# Patient Record
Sex: Male | Born: 1960
Health system: Southern US, Community
[De-identification: ages and names within clinical notes are randomized; demographics above are authoritative.]

## PROBLEM LIST (undated history)

## (undated) DIAGNOSIS — Z889 Allergy status to unspecified drugs, medicaments and biological substances status: Secondary | ICD-10-CM

## (undated) DIAGNOSIS — K219 Gastro-esophageal reflux disease without esophagitis: Secondary | ICD-10-CM

## (undated) DIAGNOSIS — T4145XA Adverse effect of unspecified anesthetic, initial encounter: Secondary | ICD-10-CM

## (undated) DIAGNOSIS — T8859XA Other complications of anesthesia, initial encounter: Secondary | ICD-10-CM

## (undated) HISTORY — PX: TONSILLECTOMY: SUR1361

---

## 1998-10-26 ENCOUNTER — Ambulatory Visit (HOSPITAL_COMMUNITY): Admission: RE | Admit: 1998-10-26 | Discharge: 1998-10-26 | Payer: Self-pay | Admitting: Gastroenterology

## 2006-03-13 ENCOUNTER — Ambulatory Visit (HOSPITAL_COMMUNITY): Admission: RE | Admit: 2006-03-13 | Discharge: 2006-03-13 | Payer: Self-pay | Admitting: Orthopedic Surgery

## 2009-01-17 ENCOUNTER — Emergency Department (HOSPITAL_COMMUNITY): Admission: EM | Admit: 2009-01-17 | Discharge: 2009-01-17 | Payer: Self-pay | Admitting: Emergency Medicine

## 2010-01-22 ENCOUNTER — Encounter: Payer: Self-pay | Admitting: Thoracic Surgery (Cardiothoracic Vascular Surgery)

## 2011-01-05 ENCOUNTER — Emergency Department (HOSPITAL_COMMUNITY)
Admission: EM | Admit: 2011-01-05 | Discharge: 2011-01-05 | Disposition: A | Discharge status: Home / Self Care | Payer: Self-pay | Source: Home / Self Care | Attending: Family Medicine | Admitting: Family Medicine

## 2011-01-05 ENCOUNTER — Encounter: Payer: Self-pay | Admitting: *Deleted

## 2011-01-05 DIAGNOSIS — L03039 Cellulitis of unspecified toe: Secondary | ICD-10-CM

## 2011-01-05 DIAGNOSIS — L03032 Cellulitis of left toe: Secondary | ICD-10-CM

## 2011-01-05 MED ORDER — TRAMADOL HCL 50 MG PO TABS: 50.0000 mg | ORAL_TABLET | Freq: Four times a day (QID) | ORAL | Status: AC | PRN

## 2011-01-05 MED ORDER — SULFAMETHOXAZOLE-TRIMETHOPRIM 800-160 MG PO TABS: 1.0000 | ORAL_TABLET | Freq: Two times a day (BID) | ORAL | Status: AC

## 2011-01-05 NOTE — ED Notes (Signed)
Trimmed toenail    3  Days  Ago  Has   Now  Symptoms  Of  Tender  Redness  And  Swelling  Of  The  l    Big toe           He  Is  Ambulatory  And  Is  In  No  Severe  Distress

## 2011-01-05 NOTE — ED Provider Notes (Signed)
History     CSN: [id number]  Arrival date & time 01/05/11  1505   First MD Initiated Contact with Patient 01/05/11 1634      Chief Complaint  Patient presents with  . Toe Pain    (Consider location/radiation/quality/duration/timing/severity/associated sxs/prior treatment) HPI Comments: Pt states he felt like his Lt great toenail was ingrowing  - medial edge, so he cut it back and the corner "to the quick" 3 days ago. The next day he began with redness and swelling and is progressively worsening. He has been applying antibiotic ointment and dressing daily without improvement.    History reviewed. No pertinent past medical history.  History reviewed. No pertinent past surgical history.  Family History  Problem Relation Age of Onset  . Hypertension Mother     History  Substance Use Topics  . Smoking status: Not on file  . Smokeless tobacco: Not on file  . Alcohol Use: Yes     socialy      Review of Systems  Constitutional: Negative for fever and chills.  Musculoskeletal: Negative for joint swelling.    Allergies  Lincocin  Home Medications   Current Outpatient Rx  Name Route Sig Dispense Refill  . SULFAMETHOXAZOLE-TRIMETHOPRIM 800-160 MG PO TABS Oral Take 1 tablet by mouth every 12 (twelve) hours. 20 tablet 0  . TRAMADOL HCL 50 MG PO TABS Oral Take 1 tablet (50 mg total) by mouth every 6 (six) hours as needed for pain. 12 tablet 0    BP 142/83  Pulse 74  Temp(Src) 98.7 F (37.1 C) (Oral)  Resp 18  SpO2 100%  Physical Exam  Nursing note and vitals reviewed. Constitutional: He appears well-developed and well-nourished. No distress.  Cardiovascular: Normal rate, regular rhythm and normal heart sounds.   Pulses:      Dorsalis pedis pulses are 2+ on the right side, and 2+ on the left side.       Posterior tibial pulses are 2+ on the right side, and 2+ on the left side.  Pulmonary/Chest: Effort normal and breath sounds normal. No respiratory distress.    Musculoskeletal:       Left foot: He exhibits tenderness and swelling. He exhibits normal range of motion, no bony tenderness and no laceration.       Feet:  Skin: Skin is warm and dry.  Psychiatric: He has a normal mood and affect.    ED Course  Procedures (including critical care time)  Labs Reviewed - No data to display No results found.   1. Paronychia of great toe of left foot       MDM   Paronychia Lt great toe, secondary to trimming nail.        Melody Comas, [place] 01/05/11 1746

## 2011-01-06 NOTE — ED Provider Notes (Signed)
Medical screening examination/treatment/procedure(s) were performed by non-physician practitioner and as supervising physician I was immediately available for consultation/collaboration.   MORENO-COLL,Hershal Eriksson; MD   Jina Olenick Moreno-Coll, MD 01/06/11 0647 

## 2014-12-14 ENCOUNTER — Encounter (HOSPITAL_COMMUNITY): Payer: Self-pay | Admitting: *Deleted

## 2014-12-14 ENCOUNTER — Emergency Department (HOSPITAL_COMMUNITY)
Admission: EM | Admit: 2014-12-14 | Discharge: 2014-12-14 | Disposition: A | Discharge status: Home / Self Care | Payer: 59 | Source: Home / Self Care

## 2014-12-14 DIAGNOSIS — J069 Acute upper respiratory infection, unspecified: Secondary | ICD-10-CM

## 2014-12-14 MED ORDER — AMOXICILLIN 250 MG PO CAPS: 250.0000 mg | ORAL_CAPSULE | Freq: Two times a day (BID) | ORAL | Status: DC

## 2014-12-14 NOTE — ED Notes (Signed)
Sinus  Drainage   /  Congestion   With   Symptoms  X 3-4  Days   With  Associated  Cough

## 2014-12-14 NOTE — Discharge Instructions (Signed)

## 2014-12-14 NOTE — ED Provider Notes (Signed)
CSN: [id number]     Arrival date & time 12/14/14  1706 History   None    No chief complaint on file.  (Consider location/radiation/quality/duration/timing/severity/associated sxs/prior Treatment) The history is provided by the patient. No language interpreter was used.   URI symptoms for the last few days. Body aches, cough. OTC meds not helping. No past medical history on file. No past surgical history on file. Family History  Problem Relation Age of Onset  . Hypertension Mother    Social History  Substance Use Topics  . Smoking status: Not on file  . Smokeless tobacco: Not on file  . Alcohol Use: Yes     Comment: socialy    Review of Systems  Constitutional: Negative.   HENT: Positive for congestion.   Eyes: Negative.   Respiratory: Positive for cough.     Allergies  Lincomycin hcl  Home Medications   Prior to Admission medications   Not on File   Meds Ordered and Administered this Visit  Medications - No data to display  There were no vitals taken for this visit. No data found.   Physical Exam  Constitutional: He is oriented to person, place, and time. He appears well-developed and well-nourished.  HENT:  Head: Normocephalic and atraumatic.  Right Ear: External ear normal.  Left Ear: External ear normal.  Mouth/Throat: Oropharynx is clear and moist.  Eyes: Conjunctivae are normal.  Neck: Normal range of motion. Neck supple.  Cardiovascular: Normal rate.   Pulmonary/Chest: Effort normal.  Abdominal: Soft.  Musculoskeletal: Normal range of motion.  Lymphadenopathy:    He has no cervical adenopathy.  Neurological: He is alert and oriented to person, place, and time.  Skin: Skin is warm and dry.  Psychiatric: He has a normal mood and affect. His behavior is normal. Thought content normal.  Nursing note and vitals reviewed.   ED Course  Procedures (including critical care time)  Labs Review Labs Reviewed - No data to display  Imaging Review No  results found.   Visual Acuity Review  Right Eye Distance:   Left Eye Distance:   Bilateral Distance:    Right Eye Near:   Left Eye Near:    Bilateral Near:         MDM   1. Acute URI    Continue symptomatic treatment at home. No indication for antibiotics at this time. Structures of care provided discharged home in stable condition.  THIS NOTE WAS GENERATED USING A VOICE RECOGNITION SOFTWARE PROGRAM. ALL REASONABLE EFFORTS  WERE MADE TO PROOFREAD THIS DOCUMENT FOR ACCURACY.     Tharon AquasFrank C Kacey Dysert, PA 12/15/14 1500

## 2015-09-09 DIAGNOSIS — M25561 Pain in right knee: Secondary | ICD-10-CM | POA: Diagnosis not present

## 2015-09-09 DIAGNOSIS — M25562 Pain in left knee: Secondary | ICD-10-CM | POA: Diagnosis not present

## 2015-09-23 DIAGNOSIS — M25561 Pain in right knee: Secondary | ICD-10-CM | POA: Diagnosis not present

## 2015-09-23 DIAGNOSIS — M23321 Other meniscus derangements, posterior horn of medial meniscus, right knee: Secondary | ICD-10-CM | POA: Diagnosis not present

## 2015-09-26 NOTE — Progress Notes (Signed)
Scheduling pre op appt- please place SURGICAL ORDERS IN EPIC   thanks 

## 2015-09-27 ENCOUNTER — Ambulatory Visit: Payer: Self-pay | Admitting: Orthopedic Surgery

## 2015-09-28 ENCOUNTER — Encounter (HOSPITAL_COMMUNITY): Payer: Self-pay

## 2015-09-29 ENCOUNTER — Encounter (HOSPITAL_COMMUNITY): Payer: Self-pay

## 2015-09-29 ENCOUNTER — Ambulatory Visit: Payer: Self-pay | Admitting: Orthopedic Surgery

## 2015-09-29 ENCOUNTER — Encounter (HOSPITAL_COMMUNITY)
Admission: RE | Admit: 2015-09-29 | Discharge: 2015-09-29 | Disposition: A | Discharge status: Home / Self Care | Payer: 59 | Source: Ambulatory Visit | Attending: Orthopedic Surgery | Admitting: Orthopedic Surgery

## 2015-09-29 DIAGNOSIS — X58XXXA Exposure to other specified factors, initial encounter: Secondary | ICD-10-CM | POA: Diagnosis not present

## 2015-09-29 DIAGNOSIS — Z01818 Encounter for other preprocedural examination: Secondary | ICD-10-CM | POA: Insufficient documentation

## 2015-09-29 DIAGNOSIS — S83206A Unspecified tear of unspecified meniscus, current injury, right knee, initial encounter: Secondary | ICD-10-CM | POA: Insufficient documentation

## 2015-09-29 HISTORY — DX: Gastro-esophageal reflux disease without esophagitis: K21.9

## 2015-09-29 HISTORY — DX: Adverse effect of unspecified anesthetic, initial encounter: T41.45XA

## 2015-09-29 HISTORY — DX: Allergy status to unspecified drugs, medicaments and biological substances: Z88.9

## 2015-09-29 HISTORY — DX: Other complications of anesthesia, initial encounter: T88.59XA

## 2015-09-29 NOTE — Patient Instructions (Addendum)
Samuel Sims  09/29/2015   Your procedure is scheduled on: 10-05-15  Report to [facility] Main  Entrance take [facility]  elevators to 3rd floor to  Short Stay Center at   3:30 PM.  Call this number if you have problems the morning of surgery [phone]   Remember: ONLY 1 PERSON MAY GO WITH YOU TO SHORT STAY TO GET  READY MORNING OF YOUR SURGERY.  Do not eat food or drink liquids :After Midnight., exception- may have Clear liquids 12 midnight to 1100 AM, then nothing.   CLEAR LIQUID DIET   Foods Allowed                                                                     Foods Excluded  Coffee and tea, regular and decaf                             liquids that you cannot  Plain Jell-O in any flavor                                             see through such as: Fruit ices (not with fruit pulp)                                     milk, soups, orange juice  Iced Popsicles                                    All solid food Carbonated beverages, regular and diet                                    Cranberry, grape and apple juices Sports drinks like Gatorade Lightly seasoned clear broth or consume(fat free) Sugar, honey syrup   _____________________________________________________________________       Take these medicines the morning of surgery with A SIP OF WATER: None. DO NOT TAKE ANY DIABETIC MEDICATIONS DAY OF YOUR SURGERY                               You may not have any metal on your body including hair pins and              piercings  Do not wear jewelry, make-up, lotions, powders or perfumes, deodorant             Do not wear nail polish.  Do not shave  48 hours prior to surgery.              Men may shave face and neck.   Do not bring valuables to the hospital. [facility] IS NOT             RESPONSIBLE  FOR VALUABLES.  Contacts, dentures or bridgework may not be worn into surgery.  Leave suitcase in the car. After surgery it may be brought to  your room.     Patients discharged the day of surgery will not be allowed to drive home.  Name and phone number of your driver: Samuel Sims-spouse - [phone] cell  Special Instructions: N/A              Please read over the following fact sheets you were given: _____________________________________________________________________             [facility] - Preparing for Surgery Before surgery, you can play an important role.  Because skin is not sterile, your skin needs to be as free of germs as possible.  You can reduce the number of germs on your skin by washing with CHG (chlorahexidine gluconate) soap before surgery.  CHG is an antiseptic cleaner which kills germs and bonds with the skin to continue killing germs even after washing. Please DO NOT use if you have an allergy to CHG or antibacterial soaps.  If your skin becomes reddened/irritated stop using the CHG and inform your nurse when you arrive at Short Stay. Do not shave (including legs and underarms) for at least 48 hours prior to the first CHG shower.  You may shave your face/neck. Please follow these instructions carefully:  1.  Shower with CHG Soap the night before surgery and the  morning of Surgery.  2.  If you choose to wash your hair, wash your hair first as usual with your  normal  shampoo.  3.  After you shampoo, rinse your hair and body thoroughly to remove the  shampoo.                           4.  Use CHG as you would any other liquid soap.  You can apply chg directly  to the skin and wash                       Gently with a scrungie or clean washcloth.  5.  Apply the CHG Soap to your body ONLY FROM THE NECK DOWN.   Do not use on face/ open                           Wound or open sores. Avoid contact with eyes, ears mouth and genitals (private parts).                       Wash face,  Genitals (private parts) with your normal soap.             6.  Wash thoroughly, paying special attention to the area where your surgery   will be performed.  7.  Thoroughly rinse your body with warm water from the neck down.  8.  DO NOT shower/wash with your normal soap after using and rinsing off  the CHG Soap.                9.  Pat yourself dry with a clean towel.            10.  Wear clean pajamas.            11.  Place clean sheets on your bed the night of your first shower and do not  sleep with pets. Day of  Surgery : Do not apply any lotions/deodorants the morning of surgery.  Please wear clean clothes to the hospital/surgery center.  FAILURE TO FOLLOW THESE INSTRUCTIONS MAY RESULT IN THE CANCELLATION OF YOUR SURGERY PATIENT SIGNATURE_________________________________  NURSE SIGNATURE__________________________________  ________________________________________________________________________   Rogelia MireIncentive Spirometer  An incentive spirometer is a tool that can help keep your lungs clear and active. This tool measures how well you are filling your lungs with each breath. Taking long deep breaths may help reverse or decrease the chance of developing breathing (pulmonary) problems (especially infection) following:  A long period of time when you are unable to move or be active. BEFORE THE PROCEDURE   If the spirometer includes an indicator to show your best effort, your nurse or respiratory therapist will set it to a desired goal.  If possible, sit up straight or lean slightly forward. Try not to slouch.  Hold the incentive spirometer in an upright position. INSTRUCTIONS FOR USE  1. Sit on the edge of your bed if possible, or sit up as far as you can in bed or on a chair. 2. Hold the incentive spirometer in an upright position. 3. Breathe out normally. 4. Place the mouthpiece in your mouth and seal your lips tightly around it. 5. Breathe in slowly and as deeply as possible, raising the piston or the ball toward the top of the column. 6. Hold your breath for 3-5 seconds or for as long as possible. Allow the piston or  ball to fall to the bottom of the column. 7. Remove the mouthpiece from your mouth and breathe out normally. 8. Rest for a few seconds and repeat Steps 1 through 7 at least 10 times every 1-2 hours when you are awake. Take your time and take a few normal breaths between deep breaths. 9. The spirometer may include an indicator to show your best effort. Use the indicator as a goal to work toward during each repetition. 10. After each set of 10 deep breaths, practice coughing to be sure your lungs are clear. If you have an incision (the cut made at the time of surgery), support your incision when coughing by placing a pillow or rolled up towels firmly against it. Once you are able to get out of bed, walk around indoors and cough well. You may stop using the incentive spirometer when instructed by your caregiver.  RISKS AND COMPLICATIONS  Take your time so you do not get dizzy or light-headed.  If you are in pain, you may need to take or ask for pain medication before doing incentive spirometry. It is harder to take a deep breath if you are having pain. AFTER USE  Rest and breathe slowly and easily.  It can be helpful to keep track of a log of your progress. Your caregiver can provide you with a simple table to help with this. If you are using the spirometer at home, follow these instructions: SEEK MEDICAL CARE IF:   You are having difficultly using the spirometer.  You have trouble using the spirometer as often as instructed.  Your pain medication is not giving enough relief while using the spirometer.  You develop fever of 100.5 F (38.1 C) or higher. SEEK IMMEDIATE MEDICAL CARE IF:   You cough up bloody sputum that had not been present before.  You develop fever of 102 F (38.9 C) or greater.  You develop worsening pain at or near the incision site. MAKE SURE YOU:   Understand these instructions.  Will watch  your condition.  Will get help right away if you are not doing well  or get worse. Document Released: 04/30/2006 Document Revised: 03/12/2011 Document Reviewed: 07/01/2006 [facility] Patient Information 2014 [place], [place].   ________________________________________________________________________

## 2015-09-29 NOTE — Progress Notes (Signed)
09-29-15 0930 Pt procedure does not require PCR screening, please advise if you still want this test done preop. Pt here today 1000 AM for PAT appointment.

## 2015-10-05 ENCOUNTER — Ambulatory Visit (HOSPITAL_COMMUNITY): Payer: 59 | Admitting: Certified Registered"

## 2015-10-05 ENCOUNTER — Ambulatory Visit (HOSPITAL_COMMUNITY)
Admission: RE | Admit: 2015-10-05 | Discharge: 2015-10-05 | Disposition: A | Discharge status: Home / Self Care | Payer: 59 | Source: Ambulatory Visit | Attending: Orthopedic Surgery | Admitting: Orthopedic Surgery

## 2015-10-05 ENCOUNTER — Encounter (HOSPITAL_COMMUNITY)
Admission: RE | Disposition: A | Discharge status: Home / Self Care | Payer: Self-pay | Source: Ambulatory Visit | Attending: Orthopedic Surgery

## 2015-10-05 ENCOUNTER — Encounter (HOSPITAL_COMMUNITY): Payer: Self-pay | Admitting: *Deleted

## 2015-10-05 DIAGNOSIS — Z79899 Other long term (current) drug therapy: Secondary | ICD-10-CM | POA: Diagnosis not present

## 2015-10-05 DIAGNOSIS — X58XXXA Exposure to other specified factors, initial encounter: Secondary | ICD-10-CM | POA: Insufficient documentation

## 2015-10-05 DIAGNOSIS — S83249A Other tear of medial meniscus, current injury, unspecified knee, initial encounter: Secondary | ICD-10-CM | POA: Diagnosis present

## 2015-10-05 DIAGNOSIS — M23303 Other meniscus derangements, unspecified medial meniscus, right knee: Secondary | ICD-10-CM | POA: Diagnosis not present

## 2015-10-05 DIAGNOSIS — M23331 Other meniscus derangements, other medial meniscus, right knee: Secondary | ICD-10-CM | POA: Diagnosis not present

## 2015-10-05 DIAGNOSIS — S83241A Other tear of medial meniscus, current injury, right knee, initial encounter: Secondary | ICD-10-CM | POA: Insufficient documentation

## 2015-10-05 DIAGNOSIS — M23321 Other meniscus derangements, posterior horn of medial meniscus, right knee: Secondary | ICD-10-CM | POA: Diagnosis not present

## 2015-10-05 HISTORY — PX: KNEE ARTHROSCOPY: SHX127

## 2015-10-05 SURGERY — ARTHROSCOPY, KNEE
Anesthesia: General | Site: Knee | Laterality: Right

## 2015-10-05 MED ORDER — BUPIVACAINE HCL (PF) 0.25 % IJ SOLN
INTRAMUSCULAR | Status: DC | PRN
  Administered 2015-10-05: 20 mL via INTRA_ARTICULAR

## 2015-10-05 MED ORDER — DEXTROSE 5 % IV SOLN
500.0000 mg | Freq: Once | INTRAVENOUS | Status: AC
  Administered 2015-10-05: 500 mg via INTRAVENOUS
  Filled 2015-10-05: qty 550

## 2015-10-05 MED ORDER — OXYCODONE HCL 5 MG PO TABS
5.0000 mg | ORAL_TABLET | Freq: Once | ORAL | Status: AC | PRN
  Administered 2015-10-05: 5 mg via ORAL
  Filled 2015-10-05: qty 1

## 2015-10-05 MED ORDER — FENTANYL CITRATE (PF) 250 MCG/5ML IJ SOLN
INTRAMUSCULAR | Status: AC
  Filled 2015-10-05: qty 5

## 2015-10-05 MED ORDER — KETOROLAC TROMETHAMINE 30 MG/ML IJ SOLN
INTRAMUSCULAR | Status: AC
  Filled 2015-10-05: qty 1

## 2015-10-05 MED ORDER — HYDROMORPHONE HCL 1 MG/ML IJ SOLN: 0.2500 mg | INTRAMUSCULAR | Status: DC | PRN

## 2015-10-05 MED ORDER — OXYCODONE HCL 5 MG/5ML PO SOLN: 5.0000 mg | Freq: Once | ORAL | Status: AC | PRN

## 2015-10-05 MED ORDER — PROMETHAZINE HCL 25 MG/ML IJ SOLN: 6.2500 mg | INTRAMUSCULAR | Status: DC | PRN

## 2015-10-05 MED ORDER — ONDANSETRON HCL 4 MG/2ML IJ SOLN
INTRAMUSCULAR | Status: AC
  Filled 2015-10-05: qty 2

## 2015-10-05 MED ORDER — METHOCARBAMOL 500 MG PO TABS: 500.0000 mg | ORAL_TABLET | Freq: Four times a day (QID) | ORAL | 1 refills | Status: DC

## 2015-10-05 MED ORDER — BUPIVACAINE HCL (PF) 0.25 % IJ SOLN
INTRAMUSCULAR | Status: AC
  Filled 2015-10-05: qty 30

## 2015-10-05 MED ORDER — ONDANSETRON HCL 4 MG/2ML IJ SOLN
INTRAMUSCULAR | Status: DC | PRN
  Administered 2015-10-05: 4 mg via INTRAVENOUS

## 2015-10-05 MED ORDER — ACETAMINOPHEN 10 MG/ML IV SOLN
1000.0000 mg | Freq: Once | INTRAVENOUS | Status: AC
  Administered 2015-10-05: 1000 mg via INTRAVENOUS
  Filled 2015-10-05: qty 100

## 2015-10-05 MED ORDER — CEFAZOLIN SODIUM-DEXTROSE 2-4 GM/100ML-% IV SOLN
2.0000 g | INTRAVENOUS | Status: AC
  Administered 2015-10-05: 2 g via INTRAVENOUS
  Filled 2015-10-05: qty 100

## 2015-10-05 MED ORDER — ACETAMINOPHEN 10 MG/ML IV SOLN
INTRAVENOUS | Status: AC
  Filled 2015-10-05: qty 100

## 2015-10-05 MED ORDER — MIDAZOLAM HCL 2 MG/2ML IJ SOLN
INTRAMUSCULAR | Status: AC
  Filled 2015-10-05: qty 2

## 2015-10-05 MED ORDER — LACTATED RINGERS IR SOLN
Status: DC | PRN
  Administered 2015-10-05 (×3): 3000 mL

## 2015-10-05 MED ORDER — LIDOCAINE 2% (20 MG/ML) 5 ML SYRINGE
INTRAMUSCULAR | Status: AC
  Filled 2015-10-05: qty 5

## 2015-10-05 MED ORDER — HYDROCODONE-ACETAMINOPHEN 5-325 MG PO TABS: 1.0000 | ORAL_TABLET | ORAL | 0 refills | Status: DC | PRN

## 2015-10-05 MED ORDER — DEXAMETHASONE SODIUM PHOSPHATE 10 MG/ML IJ SOLN
10.0000 mg | Freq: Once | INTRAMUSCULAR | Status: AC
  Administered 2015-10-05: 10 mg via INTRAVENOUS

## 2015-10-05 MED ORDER — FENTANYL CITRATE (PF) 250 MCG/5ML IJ SOLN
INTRAMUSCULAR | Status: DC | PRN
  Administered 2015-10-05 (×4): 50 ug via INTRAVENOUS

## 2015-10-05 MED ORDER — DEXAMETHASONE SODIUM PHOSPHATE 10 MG/ML IJ SOLN
INTRAMUSCULAR | Status: AC
  Filled 2015-10-05: qty 1

## 2015-10-05 MED ORDER — CEFAZOLIN SODIUM-DEXTROSE 2-4 GM/100ML-% IV SOLN
INTRAVENOUS | Status: AC
  Filled 2015-10-05: qty 100

## 2015-10-05 MED ORDER — LACTATED RINGERS IV SOLN
INTRAVENOUS | Status: DC
  Administered 2015-10-05: 16:00:00 via INTRAVENOUS

## 2015-10-05 MED ORDER — CHLORHEXIDINE GLUCONATE 4 % EX LIQD: 60.0000 mL | Freq: Once | CUTANEOUS | Status: DC

## 2015-10-05 MED ORDER — MIDAZOLAM HCL 2 MG/2ML IJ SOLN
INTRAMUSCULAR | Status: DC | PRN
  Administered 2015-10-05: 2 mg via INTRAVENOUS

## 2015-10-05 MED ORDER — KETOROLAC TROMETHAMINE 30 MG/ML IJ SOLN
30.0000 mg | Freq: Once | INTRAMUSCULAR | Status: AC | PRN
  Administered 2015-10-05: 30 mg via INTRAVENOUS

## 2015-10-05 MED ORDER — DEXAMETHASONE SODIUM PHOSPHATE 10 MG/ML IJ SOLN: 10.0000 mg | Freq: Once | INTRAMUSCULAR | Status: DC

## 2015-10-05 MED ORDER — PROPOFOL 10 MG/ML IV BOLUS
INTRAVENOUS | Status: DC | PRN
  Administered 2015-10-05: 180 mg via INTRAVENOUS

## 2015-10-05 MED ORDER — ACETAMINOPHEN 10 MG/ML IV SOLN: 1000.0000 mg | Freq: Once | INTRAVENOUS | Status: DC

## 2015-10-05 MED ORDER — LIDOCAINE 2% (20 MG/ML) 5 ML SYRINGE
INTRAMUSCULAR | Status: DC | PRN
  Administered 2015-10-05: 80 mg via INTRAVENOUS

## 2015-10-05 MED ORDER — PROPOFOL 10 MG/ML IV BOLUS
INTRAVENOUS | Status: AC
  Filled 2015-10-05: qty 20

## 2015-10-05 SURGICAL SUPPLY — 30 items
BANDAGE ACE 6X5 VEL STRL LF (GAUZE/BANDAGES/DRESSINGS) ×1 IMPLANT
BANDAGE ELASTIC 6 VELCRO ST LF (GAUZE/BANDAGES/DRESSINGS) ×2 IMPLANT
BLADE 4.2CUDA (BLADE) ×3 IMPLANT
CLOSURE WOUND 1/2 X4 (GAUZE/BANDAGES/DRESSINGS) ×1
COVER SURGICAL LIGHT HANDLE (MISCELLANEOUS) ×3 IMPLANT
CUFF TOURN SGL QUICK 34 (TOURNIQUET CUFF) ×3
CUFF TRNQT CYL 34X4X40X1 (TOURNIQUET CUFF) ×1 IMPLANT
DRAPE U-SHAPE 47X51 STRL (DRAPES) ×3 IMPLANT
DRSG EMULSION OIL 3X3 NADH (GAUZE/BANDAGES/DRESSINGS) ×3 IMPLANT
DRSG PAD ABDOMINAL 8X10 ST (GAUZE/BANDAGES/DRESSINGS) ×1 IMPLANT
DURAPREP 26ML APPLICATOR (WOUND CARE) ×3 IMPLANT
GAUZE SPONGE 4X4 12PLY STRL (GAUZE/BANDAGES/DRESSINGS) ×3 IMPLANT
GLOVE BIO SURGEON STRL SZ8 (GLOVE) ×3 IMPLANT
GLOVE BIOGEL PI IND STRL 8 (GLOVE) ×1 IMPLANT
GLOVE BIOGEL PI INDICATOR 8 (GLOVE) ×2
GOWN STRL REUS W/TWL LRG LVL3 (GOWN DISPOSABLE) ×3 IMPLANT
KIT BASIN OR (CUSTOM PROCEDURE TRAY) ×3 IMPLANT
MANIFOLD NEPTUNE II (INSTRUMENTS) ×3 IMPLANT
MARKER SKIN DUAL TIP RULER LAB (MISCELLANEOUS) ×3 IMPLANT
PACK ARTHROSCOPY WL (CUSTOM PROCEDURE TRAY) ×3 IMPLANT
PACK ICE MAXI GEL EZY WRAP (MISCELLANEOUS) ×3 IMPLANT
PAD ABD 8X10 STRL (GAUZE/BANDAGES/DRESSINGS) ×2 IMPLANT
PADDING CAST COTTON 6X4 STRL (CAST SUPPLIES) ×4 IMPLANT
POSITIONER SURGICAL ARM (MISCELLANEOUS) ×3 IMPLANT
STRIP CLOSURE SKIN 1/2X4 (GAUZE/BANDAGES/DRESSINGS) ×1 IMPLANT
SUT ETHILON 4 0 PS 2 18 (SUTURE) ×3 IMPLANT
TOWEL OR 17X26 10 PK STRL BLUE (TOWEL DISPOSABLE) ×3 IMPLANT
TUBING ARTHRO INFLOW-ONLY STRL (TUBING) ×3 IMPLANT
WAND HAND CNTRL MULTIVAC 90 (MISCELLANEOUS) IMPLANT
WRAP KNEE MAXI GEL POST OP (GAUZE/BANDAGES/DRESSINGS) ×3 IMPLANT

## 2015-10-05 NOTE — Anesthesia Preprocedure Evaluation (Signed)
Anesthesia Evaluation  Patient identified by MRN, date of birth, ID band Patient awake    Reviewed: Allergy & Precautions, NPO status , Patient's Chart, lab work & pertinent test results  Airway Mallampati: II  TM Distance: >3 FB Neck ROM: Full    Dental no notable dental hx.    Pulmonary neg pulmonary ROS,    Pulmonary exam normal breath sounds clear to auscultation       Cardiovascular negative cardio ROS Normal cardiovascular exam Rhythm:Regular Rate:Normal     Neuro/Psych negative neurological ROS  negative psych ROS   GI/Hepatic negative GI ROS, Neg liver ROS,   Endo/Other  negative endocrine ROS  Renal/GU negative Renal ROS  negative genitourinary   Musculoskeletal negative musculoskeletal ROS (+)   Abdominal   Peds negative pediatric ROS (+)  Hematology negative hematology ROS (+)   Anesthesia Other Findings   Reproductive/Obstetrics negative OB ROS                             Anesthesia Physical Anesthesia Plan  ASA: I  Anesthesia Plan: General   Post-op Pain Management:    Induction: Intravenous  Airway Management Planned: LMA  Additional Equipment:   Intra-op Plan:   Post-operative Plan: Extubation in OR  Informed Consent: I have reviewed the patients History and Physical, chart, labs and discussed the procedure including the risks, benefits and alternatives for the proposed anesthesia with the patient or authorized representative who has indicated his/her understanding and acceptance.   Dental advisory given  Plan Discussed with: CRNA and Surgeon  Anesthesia Plan Comments:         Anesthesia Quick Evaluation  

## 2015-10-05 NOTE — H&P (Signed)
CC- Samuel Sims is a 55 y.o. male who presents with right knee pain.  HPI- . Knee Pain: Patient presents with knee pain involving the  right knee. Onset of the symptoms was several months ago. Inciting event: none known. Current symptoms include giving out, pain located medially and popping sensation. Pain is aggravated by lateral movements, pivoting, rising after sitting, squatting, standing and walking.  Patient has had no prior knee problems. Evaluation to date: MRI: abnormal medial meniscal tear. Treatment to date: rest.  Past Medical History:  Diagnosis Date  . Complication of anesthesia    Dental procedures required more than planned to sedate.  Marland Kitchen. GERD (gastroesophageal reflux disease)    past history -used OTC "Prilosec", no problems now  . History of seasonal allergies    more in younger yrs, not in adult years"ragweed mostly"    Past Surgical History:  Procedure Laterality Date  . TONSILLECTOMY      Prior to Admission medications   Medication Sig Start Date End Date Taking? Authorizing Provider  Calcium Carb-Cholecalciferol (CALCIUM 500 + D3 PO) Take 1 tablet by mouth daily. 500 mg D3   Yes Historical Provider, MD  ibuprofen (ADVIL,MOTRIN) 200 MG tablet Take 200 mg by mouth every 6 (six) hours as needed for moderate pain.    Yes Historical Provider, MD  magnesium gluconate (MAGONATE) 500 MG tablet Take 500 mg by mouth daily.   Yes Historical Provider, MD  Multiple Vitamin (MULTIVITAMIN) tablet Take 1 tablet by mouth daily.   Yes Historical Provider, MD  vitamin E 400 UNIT capsule Take 400 Units by mouth daily.   Yes Historical Provider, MD   KNEE EXAM antalgic gait, soft tissue tenderness over medial joint line, no effusion, negative drawer sign, collateral ligaments intact  Physical Examination: General appearance - alert, well appearing, and in no distress Mental status - alert, oriented to person, place, and time Chest - clear to auscultation, no wheezes, rales or  rhonchi, symmetric air entry Heart - normal rate, regular rhythm, normal S1, S2, no murmurs, rubs, clicks or gallops Abdomen - soft, nontender, nondistended, no masses or organomegaly Neurological - alert, oriented, normal speech, no focal findings or movement disorder noted   Asessment/Plan--- Right knee medial meniscal tear- - Plan right knee arthroscopy with meniscal debridement. Procedure risks and potential comps discussed with patient who elects to proceed. Goals are decreased pain and increased function with a high likelihood of achieving both

## 2015-10-05 NOTE — Anesthesia Procedure Notes (Signed)
Procedure Name: LMA Insertion Date/Time: 10/05/2015 4:48 PM Performed by: Minerva EndsMIRARCHI, Isella Slatten M Pre-anesthesia Checklist: Patient identified, Emergency Drugs available, Suction available and Patient being monitored Patient Re-evaluated:Patient Re-evaluated prior to inductionOxygen Delivery Method: Circle System Utilized Preoxygenation: Pre-oxygenation with 100% oxygen Intubation Type: IV induction Ventilation: Mask ventilation without difficulty LMA: LMA inserted LMA Size: 4.0 Number of attempts: 1 Airway Equipment and Method: Bite block Placement Confirmation: positive ETCO2 Tube secured with: Tape Dental Injury: Teeth and Oropharynx as per pre-operative assessment  Comments: Smooth iv induction   Am crna atraumatic lma-- teeth and mouth as preop

## 2015-10-05 NOTE — Transfer of Care (Signed)
Immediate Anesthesia Transfer of Care Note  Patient: Hessie DienerRoger L Piccini  Procedure(s) Performed: Procedure(s): ARTHROSCOPY KNEE WITH MEDIAL MENISCAL DEBRIDEMENT (Right)  Patient Location: PACU  Anesthesia Type:General  Level of Consciousness: awake  Airway & Oxygen Therapy: Patient Spontanous Breathing and Patient connected to face mask oxygen  Post-op Assessment: Report given to RN and Post -op Vital signs reviewed and stable  Post vital signs: Reviewed and stable  Last Vitals:  Vitals:   10/05/15 1521  BP: (!) 135/95  Pulse: 75  Resp: 16  Temp: 37.2 C    Last Pain:  Vitals:   10/05/15 1605  TempSrc:   PainSc: 0-No pain      Patients Stated Pain Goal: 3 (10/05/15 1605)  Complications: No apparent anesthesia complications

## 2015-10-05 NOTE — Discharge Instructions (Signed)
General Anesthesia, Adult, Care After °Refer to this sheet in the next few weeks. These instructions provide you with information on caring for yourself after your procedure. Your health care provider may also give you more specific instructions. Your treatment has been planned according to current medical practices, but problems sometimes occur. Call your health care provider if you have any problems or questions after your procedure. °WHAT TO EXPECT AFTER THE PROCEDURE °After the procedure, it is typical to experience: °· Sleepiness. °· Nausea and vomiting. °HOME CARE INSTRUCTIONS °· For the first 24 hours after general anesthesia: °¨ Have a responsible person with you. °¨ Do not drive a car. If you are alone, do not take public transportation. °¨ Do not drink alcohol. °¨ Do not take medicine that has not been prescribed by your health care provider. °¨ Do not sign important papers or make important decisions. °¨ You may resume a normal diet and activities as directed by your health care provider. °· Change bandages (dressings) as directed. °· If you have questions or problems that seem related to general anesthesia, call the hospital and ask for the anesthetist or anesthesiologist on call. °SEEK MEDICAL CARE IF: °· You have nausea and vomiting that continue the day after anesthesia. °· You develop a rash. °SEEK IMMEDIATE MEDICAL CARE IF:  °· You have difficulty breathing. °· You have chest pain. °· You have any allergic problems. °  °This information is not intended to replace advice given to you by your health care provider. Make sure you discuss any questions you have with your health care provider. °  °Document Released: 03/26/2000 Document Revised: 01/08/2014 Document Reviewed: 04/18/2011 °Elsevier Interactive Patient Education ©2016 [employer] °   ° °Dr. Frank Aluisio °Total Joint Specialist °[facility] °[address] °[place], [place] [address] °[phone] ° ° °Arthroscopic  Procedure, Knee °An arthroscopic procedure can find what is wrong with your knee. °PROCEDURE °Arthroscopy is a surgical technique that allows your orthopedic surgeon to diagnose and treat your knee injury with accuracy. They will look into your knee through a small instrument. This is almost like a small (pencil sized) telescope. Because arthroscopy affects your knee less than open knee surgery, you can anticipate a more rapid recovery. Taking an active role by following your caregiver's instructions will help with rapid and complete recovery. Use crutches, rest, elevation, ice, and knee exercises as instructed. The length of recovery depends on various factors including type of injury, age, physical condition, medical conditions, and your rehabilitation. °Your knee is the joint between the large bones (femur and tibia) in your leg. Cartilage covers these bone ends which are smooth and slippery and allow your knee to bend and move smoothly. Two menisci, thick, semi-lunar shaped pads of cartilage which form a rim inside the joint, help absorb shock and stabilize your knee. Ligaments bind the bones together and support your knee joint. Muscles move the joint, help support your knee, and take stress off the joint itself. Because of this all programs and physical therapy to rehabilitate an injured or repaired knee require rebuilding and strengthening your muscles. °AFTER THE PROCEDURE °· After the procedure, you will be moved to a recovery area until most of the effects of the medication have worn off. Your caregiver will discuss the test results with you.  °· Only take over-the-counter or prescription medicines for pain, discomfort, or fever as directed by your caregiver.  °SEEK MEDICAL CARE IF:  °· You have increased bleeding from your wounds.  °·   You see redness, swelling, or have increasing pain in your wounds.  °· You have pus coming from your wound.  °· You have an oral temperature above 102° F (38.9° C).  °· You  notice a bad smell coming from the wound or dressing.  °· You have severe pain with any motion of your knee.  °SEEK IMMEDIATE MEDICAL CARE IF:  °· You develop a rash.  °· You have difficulty breathing.  °· You have any allergic problems.  °FURTHER INSTRUCTIONS:  °· ICE to the affected knee every three hours for 30 minutes at a time and then as needed for pain and swelling.  Continue to use ice on the knee for pain and swelling from surgery. You may notice swelling that will progress down to the foot and ankle.  This is normal after surgery.  Elevate the leg when you are not up walking on it.   ° °DIET °You may resume your previous home diet once your are discharged from the hospital. ° °DRESSING / WOUND CARE / SHOWERING °You may start showering two days after being discharged home but do not submerge the incisions under water.  °Change dressing 48 hours after the procedure and then cover the small incisions with band aids until your follow up visit. °Change the surgical dressings daily and reapply a dry dressing each time.  ° °ACTIVITY °Walk with your walker as instructed. °Use walker as long as suggested by your caregivers. °Avoid periods of inactivity such as sitting longer than an hour when not asleep. This helps prevent blood clots.  °You may resume a sexual relationship in one month or when given the OK by your doctor.  °You may return to work once you are cleared by your doctor.  °Do not drive a car for 6 weeks or until released by you surgeon.  °Do not drive while taking narcotics. ° °WEIGHT BEARING AS TOLERATED °  °POSTOPERATIVE CONSTIPATION PROTOCOL °Constipation - defined medically as fewer than three stools per week and severe constipation as less than one stool per week. ° °One of the most common issues patients have following surgery is constipation.  Even if you have a regular bowel pattern at home, your normal regimen is likely to be disrupted due to multiple reasons following surgery.  Combination of  anesthesia, postoperative narcotics, change in appetite and fluid intake all can affect your bowels.  In order to avoid complications following surgery, here are some recommendations in order to help you during your recovery period. ° °Colace (docusate) - Pick up an over-the-counter form of Colace or another stool softener and take twice a day as long as you are requiring postoperative pain medications.  Take with a full glass of water daily.  If you experience loose stools or diarrhea, hold the colace until you stool forms back up.  If your symptoms do not get better within 1 week or if they get worse, check with your doctor. ° °Dulcolax (bisacodyl) - Pick up over-the-counter and take as directed by the product packaging as needed to assist with the movement of your bowels.  Take with a full glass of water.  Use this product as needed if not relieved by Colace only.  ° °MiraLax (polyethylene glycol) - Pick up over-the-counter to have on hand.  MiraLax is a solution that will increase the amount of water in your bowels to assist with bowel movements.  Take as directed and can mix with a glass of water, juice, soda, coffee, or tea.    Take if you go more than two days without a movement. °Do not use MiraLax more than once per day. Call your doctor if you are still constipated or irregular after using this medication for 7 days in a row. ° °If you continue to have problems with postoperative constipation, please contact the office for further assistance and recommendations.  If you experience "the worst abdominal pain ever" or develop nausea or vomiting, please contact the office immediatly for further recommendations for treatment. ° °ITCHING ° If you experience itching with your medications, try taking only a single pain pill, or even half a pain pill at a time.  You can also use Benadryl over the counter for itching or also to help with sleep.  ° °TED HOSE STOCKINGS °Wear the elastic stockings on both legs for three  weeks following surgery during the day but you may remove then at night for sleeping. ° °MEDICATIONS °See your medication summary on the “After Visit Summary” that the nursing staff will review with you prior to discharge.  You may have some home medications which will be placed on hold until you complete the course of blood thinner medication.  It is important for you to complete the blood thinner medication as prescribed by your surgeon.  Continue your approved medications as instructed at time of discharge. °Do not drive while taking narcotics.  ° °PRECAUTIONS °If you experience chest pain or shortness of breath - call 911 immediately for transfer to the hospital emergency department.  °If you develop a fever greater that 101 F, purulent drainage from wound, increased redness or drainage from wound, foul odor from the wound/dressing, or calf pain - CONTACT YOUR SURGEON.   °                                                °FOLLOW-UP APPOINTMENTS °Make sure you keep all of your appointments after your operation with your surgeon and caregivers. You should call the office at [phone]  and make an appointment for approximately one week after the date of your surgery or on the date instructed by your surgeon outlined in the "After Visit Summary". ° °RANGE OF MOTION AND STRENGTHENING EXERCISES  °Rehabilitation of the knee is important following a knee injury or an operation. After just a few days of immobilization, the muscles of the thigh which control the knee become weakened and shrink (atrophy). Knee exercises are designed to build up the tone and strength of the thigh muscles and to improve knee motion. Often times heat used for twenty to thirty minutes before working out will loosen up your tissues and help with improving the range of motion but do not use heat for the first two weeks following surgery. These exercises can be done on a training (exercise) mat, on the floor, on a table or on a bed. Use what  ever works the best and is most comfortable for you Knee exercises include: ° °QUAD STRENGTHENING EXERCISES °Strengthening Quadriceps Sets ° °Tighten muscles on top of thigh by pushing knees down into floor or table. °Hold for 20 seconds. Repeat 10 times. °Do 2 sessions per day. ° ° ° ° °Strengthening Terminal Knee Extension ° °With knee bent over bolster, straighten knee by tightening muscle on top of thigh. Be sure to keep bottom of knee on bolster. °Hold for 20 seconds. Repeat   10 times. °Do 2 sessions per day. ° ° °Straight Leg with Bent Knee ° °Lie on back with opposite leg bent. Keep involved knee slightly bent at knee and raise leg 4-6". Hold for 10 seconds. °Repeat 20 times per set. °Do 2 sets per session. °Do 2 sessions per day. ° °

## 2015-10-05 NOTE — Anesthesia Postprocedure Evaluation (Signed)
Anesthesia Post Note  Patient: Samuel Sims  Procedure(s) Performed: Procedure(s) (LRB): ARTHROSCOPY KNEE WITH MEDIAL MENISCAL DEBRIDEMENT (Right)  Patient location during evaluation: PACU Anesthesia Type: General Level of consciousness: awake and alert Pain management: pain level controlled Vital Signs Assessment: post-procedure vital signs reviewed and stable Respiratory status: spontaneous breathing, nonlabored ventilation, respiratory function stable and patient connected to nasal cannula oxygen Cardiovascular status: blood pressure returned to baseline and stable Postop Assessment: no signs of nausea or vomiting Anesthetic complications: no    Last Vitals:  Vitals:   10/05/15 1800 10/05/15 1813  BP:  (!) 138/91  Pulse: 63 62  Resp: 13 15  Temp: 36.6 C 36.9 C    Last Pain:  Vitals:   10/05/15 1821  TempSrc:   PainSc: 5                  Masayo Fera S

## 2015-10-05 NOTE — Interval H&P Note (Signed)
History and Physical Interval Note:  10/05/2015 4:36 PM  Samuel Sims L Kanzler  has presented today for surgery, with the diagnosis of RIGHT KNEE MEDIAL MENISCUS TEAR  The various methods of treatment have been discussed with the patient and family. After consideration of risks, benefits and other options for treatment, the patient has consented to  Procedure(s): ARTHROSCOPY KNEE WITH MENISCUS DEBRIDEMENT (Right) as a surgical intervention .  The patient's history has been reviewed, patient examined, no change in status, stable for surgery.  I have reviewed the patient's chart and labs.  Questions were answered to the patient's satisfaction.     Loanne DrillingALUISIO,Hara Milholland V

## 2015-10-05 NOTE — Op Note (Signed)
Operative Report- KNEE ARTHROSCOPY  Preoperative diagnosis-  Right knee medial meniscal tear  Postoperative diagnosis Right- knee medial meniscal tear  Procedure- Right knee arthroscopy with medial  meniscal debridement    Surgeon- Gus RankinFrank V. Jorgina Binning, MD  Anesthesia-General  EBL-  Minimal  Complications- None  Condition- PACU - hemodynamically stable.  Brief clinical note- -Samuel Sims is a 55 y.o.  male with a several month history of Right pain and mechanical symptoms. Exam and history suggested medial  meniscal tear confirmed by MRI. The patient presents now for arthroscopy and debridement   Procedure in detail -       After successful administration of General anesthetic, a tourmiquet is placed high on the Right  thigh and the Right lower extremity is prepped and draped in the usual sterile fashion. Time out is performed by the surgical team. Standard superomedial and inferolateral portal sites are marked and incisions made with an 11 blade. The inflow cannula is passed through the superomedial portal and camera through the inferolateral portal and inflow is initiated. Arthroscopic visualization proceeds.      The undersurface of the patella and trochlea are visualized and they are normal. The medial and lateral gutters are visualized and there are  no loose bodies. Flexion and valgus force is applied to the knee and the medial compartment is entered. A spinal needle is passed into the joint through the site marked for the inferomedial portal. A small incision is made and the dilator passed into the joint. The findings for the medial compartment are unstable tear of body and posterior horn medial meniscus with Grade II and II degenerative changes of small 1 x 2 cm area of medial femoral condyle without any unstable chondral lesion . The tear is debrided to a stable base with baskets and a shaver and sealed off with the Arthrocare. It is probed and found to be stable.     The  intercondylar notch is visualized and the ACL appears normal . The lateral compartment is entered and the findings are normal .      The joint is again inspected and there are no other tears, defects or loose bodies identified. The arthroscopic equipment is then removed from the inferior portals which are closed with interrupted 4-0 nylon. 20 ml of .25% Marcaine with epinephrine are injected through the inflow cannula and the cannula is then removed and the portal closed with nylon. The incisions are cleaned and dried and a bulky sterile dressing is applied. The patient is then awakened and transported to recovery in stable condition.   10/05/2015, 5:27 PM

## 2015-10-06 ENCOUNTER — Encounter (HOSPITAL_COMMUNITY): Payer: Self-pay | Admitting: Orthopedic Surgery

## 2015-10-21 DIAGNOSIS — Z4789 Encounter for other orthopedic aftercare: Secondary | ICD-10-CM | POA: Diagnosis not present

## 2015-11-11 DIAGNOSIS — H52223 Regular astigmatism, bilateral: Secondary | ICD-10-CM | POA: Diagnosis not present

## 2015-11-11 MED FILL — TOBRAMYCIN-DEXAMETH OPTH SU: 0.3-0.1 | 7 days supply | Qty: 5 | Fill #0

## 2015-12-09 DIAGNOSIS — M23321 Other meniscus derangements, posterior horn of medial meniscus, right knee: Secondary | ICD-10-CM | POA: Diagnosis not present

## 2016-01-31 ENCOUNTER — Encounter (HOSPITAL_COMMUNITY): Payer: Self-pay | Admitting: Emergency Medicine

## 2016-01-31 ENCOUNTER — Ambulatory Visit (HOSPITAL_COMMUNITY)
Admission: EM | Admit: 2016-01-31 | Discharge: 2016-01-31 | Disposition: A | Discharge status: Home / Self Care | Payer: 59 | Attending: Family Medicine | Admitting: Family Medicine

## 2016-01-31 DIAGNOSIS — R05 Cough: Secondary | ICD-10-CM

## 2016-01-31 DIAGNOSIS — R69 Illness, unspecified: Principal | ICD-10-CM

## 2016-01-31 DIAGNOSIS — R509 Fever, unspecified: Secondary | ICD-10-CM

## 2016-01-31 DIAGNOSIS — J111 Influenza due to unidentified influenza virus with other respiratory manifestations: Secondary | ICD-10-CM

## 2016-01-31 DIAGNOSIS — M791 Myalgia: Secondary | ICD-10-CM

## 2016-01-31 DIAGNOSIS — J3489 Other specified disorders of nose and nasal sinuses: Secondary | ICD-10-CM

## 2016-01-31 MED ORDER — IBUPROFEN 800 MG PO TABS: 800.0000 mg | ORAL_TABLET | Freq: Three times a day (TID) | ORAL | 0 refills | Status: DC

## 2016-01-31 NOTE — ED Triage Notes (Signed)
Pt began with body aches late this morning, but 1500 he states he had a fever of 101.2.  He took tylenol and it brought the fever down at 1810

## 2016-01-31 NOTE — ED Provider Notes (Signed)
[facility]    CSN: [id number] Arrival date & time: 01/31/16  1955     History   Chief Complaint Chief Complaint  Patient presents with  . Generalized Body Aches  . Chills  . Fever    HPI Samuel Sims is a 56 y.o. male.   The history is provided by the patient.  Influenza  Presenting symptoms: cough, fever, myalgias and rhinorrhea   Severity:  Mild Onset quality:  Sudden Duration:  12 hours Progression:  Worsening Chronicity:  New Relieved by:  None tried Worsened by:  Nothing Ineffective treatments:  None tried Associated symptoms: chills, decreased appetite and decreased physical activity     Past Medical History:  Diagnosis Date  . Complication of anesthesia    Dental procedures required more than planned to sedate.  Marland Kitchen GERD (gastroesophageal reflux disease)    past history -used OTC "Prilosec", no problems now  . History of seasonal allergies    more in younger yrs, not in adult years"ragweed mostly"    Patient Active Problem List   Diagnosis Date Noted  . Acute medial meniscal tear 10/05/2015    Past Surgical History:  Procedure Laterality Date  . KNEE ARTHROSCOPY Right 10/05/2015   Procedure: ARTHROSCOPY KNEE WITH MEDIAL MENISCAL DEBRIDEMENT;  Surgeon: Ollen Gross, MD;  Location: [facility];  Service: Orthopedics;  Laterality: Right;  . TONSILLECTOMY         Home Medications    Prior to Admission medications   Medication Sig Start Date End Date Taking? Authorizing Provider  Calcium Carb-Cholecalciferol (CALCIUM 500 + D3 PO) Take 1 tablet by mouth daily. 500 mg D3   Yes Historical Provider, MD  magnesium gluconate (MAGONATE) 500 MG tablet Take 500 mg by mouth daily.   Yes Historical Provider, MD  Multiple Vitamin (MULTIVITAMIN) tablet Take 1 tablet by mouth daily.   Yes Historical Provider, MD  vitamin E 400 UNIT capsule Take 400 Units by mouth daily.   Yes Historical Provider, MD  HYDROcodone-acetaminophen (NORCO) 5-325 MG tablet  Take 1-2 tablets by mouth every 4 (four) hours as needed for moderate pain. 10/05/15   Ollen Gross, MD  ibuprofen (ADVIL,MOTRIN) 800 MG tablet Take 1 tablet (800 mg total) by mouth 3 (three) times daily. 01/31/16   Linna Hoff, MD  methocarbamol (ROBAXIN) 500 MG tablet Take 1 tablet (500 mg total) by mouth 4 (four) times daily. As needed for muscle spasm 10/05/15   Ollen Gross, MD    Family History Family History  Problem Relation Age of Onset  . Hypertension Mother     Social History Social History  Substance Use Topics  . Smoking status: Never Smoker  . Smokeless tobacco: Never Used  . Alcohol use Yes     Comment: socialy     Allergies   Lincomycin hcl and Benadryl [diphenhydramine hcl]   Review of Systems Review of Systems  Constitutional: Positive for chills, decreased appetite and fever.  HENT: Positive for rhinorrhea.   Respiratory: Positive for cough. Negative for wheezing.   Cardiovascular: Negative.   Gastrointestinal: Negative.   Genitourinary: Negative.   Musculoskeletal: Positive for myalgias.     Physical Exam Triage Vital Signs ED Triage Vitals [01/31/16 2038]  Enc Vitals Group     BP 141/76     Pulse Rate 101     Resp      Temp 99.8 F (37.7 C)     Temp Source Oral     SpO2 98 %  Weight      Height      Head Circumference      Peak Flow      Pain Score      Pain Loc      Pain Edu?      Excl. in GC?    No data found.   Updated Vital Signs BP 141/76 (BP Location: Right Arm)   Pulse 101   Temp 99.8 F (37.7 C) (Oral)   SpO2 98%   Visual Acuity Right Eye Distance:   Left Eye Distance:   Bilateral Distance:    Right Eye Near:   Left Eye Near:    Bilateral Near:     Physical Exam  Constitutional: He is oriented to person, place, and time. He appears well-developed and well-nourished. No distress.  HENT:  Right Ear: External ear normal.  Left Ear: External ear normal.  Nose: Nose normal.  Mouth/Throat: Oropharynx is clear  and moist.  Eyes: Pupils are equal, round, and reactive to light.  Neck: Normal range of motion. Neck supple.  Cardiovascular: Normal rate, regular rhythm, normal heart sounds and intact distal pulses.   Pulmonary/Chest: Effort normal and breath sounds normal.  Abdominal: Soft. Bowel sounds are normal.  Lymphadenopathy:    He has no cervical adenopathy.  Neurological: He is alert and oriented to person, place, and time.  Skin: Skin is warm and dry.     UC Treatments / Results  Labs (all labs ordered are listed, but only abnormal results are displayed) Labs Reviewed - No data to display  EKG  EKG Interpretation None       Radiology No results found.  Procedures Procedures (including critical care time)  Medications Ordered in UC Medications - No data to display   Initial Impression / Assessment and Plan / UC Course  I have reviewed the triage vital signs and the nursing notes.  Pertinent labs & imaging results that were available during my care of the patient were reviewed by me and considered in my medical decision making (see chart for details).       Final Clinical Impressions(s) / UC Diagnoses   Final diagnoses:  Influenza-like illness    New Prescriptions Discharge Medication List as of 01/31/2016  9:22 PM       Linna HoffJames D Kindl, MD 01/31/16 2135

## 2016-02-01 MED FILL — OSELTAMIVIR PHOSPHATE 75 MG: 75 | 5 days supply | Qty: 10 | Fill #0

## 2016-02-10 DIAGNOSIS — N401 Enlarged prostate with lower urinary tract symptoms: Secondary | ICD-10-CM | POA: Diagnosis not present

## 2016-02-10 DIAGNOSIS — R350 Frequency of micturition: Secondary | ICD-10-CM | POA: Diagnosis not present

## 2016-02-10 MED FILL — TAMSULOSIN HCL 0.4 MG CAP: 0.4 | 30 days supply | Qty: 30 | Fill #0

## 2016-02-10 MED FILL — SULFAMETHOXAZOLE/TMP DS TAB: 800-160 | 15 days supply | Qty: 30 | Fill #0

## 2016-02-24 DIAGNOSIS — M23321 Other meniscus derangements, posterior horn of medial meniscus, right knee: Secondary | ICD-10-CM | POA: Diagnosis not present

## 2016-02-24 DIAGNOSIS — Z4789 Encounter for other orthopedic aftercare: Secondary | ICD-10-CM | POA: Diagnosis not present

## 2016-03-14 DIAGNOSIS — B3742 Candidal balanitis: Secondary | ICD-10-CM | POA: Diagnosis not present

## 2016-03-14 DIAGNOSIS — N403 Nodular prostate with lower urinary tract symptoms: Secondary | ICD-10-CM | POA: Diagnosis not present

## 2016-03-14 DIAGNOSIS — R3912 Poor urinary stream: Secondary | ICD-10-CM | POA: Diagnosis not present

## 2016-03-14 DIAGNOSIS — N401 Enlarged prostate with lower urinary tract symptoms: Secondary | ICD-10-CM | POA: Diagnosis not present

## 2016-03-14 DIAGNOSIS — R350 Frequency of micturition: Secondary | ICD-10-CM | POA: Diagnosis not present

## 2016-03-14 MED FILL — CLOTRIMAZOLE-BETAMETHASONE: 1-0.05 | 10 days supply | Qty: 15 | Fill #0

## 2016-05-25 MED FILL — CLOTRIMAZOLE-BETAMETHASONE: 1-0.05 | 10 days supply | Qty: 15 | Fill #1

## 2017-02-28 DIAGNOSIS — H52223 Regular astigmatism, bilateral: Secondary | ICD-10-CM | POA: Diagnosis not present

## 2017-09-06 DIAGNOSIS — R293 Abnormal posture: Secondary | ICD-10-CM | POA: Diagnosis not present

## 2017-09-06 DIAGNOSIS — M2560 Stiffness of unspecified joint, not elsewhere classified: Secondary | ICD-10-CM | POA: Diagnosis not present

## 2017-09-06 DIAGNOSIS — M9903 Segmental and somatic dysfunction of lumbar region: Secondary | ICD-10-CM | POA: Diagnosis not present

## 2017-09-06 DIAGNOSIS — M545 Low back pain: Secondary | ICD-10-CM | POA: Diagnosis not present

## 2017-09-09 DIAGNOSIS — R293 Abnormal posture: Secondary | ICD-10-CM | POA: Diagnosis not present

## 2017-09-09 DIAGNOSIS — M545 Low back pain: Secondary | ICD-10-CM | POA: Diagnosis not present

## 2017-09-09 DIAGNOSIS — M9903 Segmental and somatic dysfunction of lumbar region: Secondary | ICD-10-CM | POA: Diagnosis not present

## 2017-09-09 DIAGNOSIS — M2560 Stiffness of unspecified joint, not elsewhere classified: Secondary | ICD-10-CM | POA: Diagnosis not present

## 2018-02-28 ENCOUNTER — Ambulatory Visit (INDEPENDENT_AMBULATORY_CARE_PROVIDER_SITE_OTHER): Payer: Self-pay | Admitting: Nurse Practitioner

## 2018-02-28 VITALS — BP 135/85 | HR 83 | Temp 99.1°F | Resp 18 | Wt 220.8 lb

## 2018-02-28 DIAGNOSIS — R6889 Other general symptoms and signs: Secondary | ICD-10-CM

## 2018-02-28 MED ORDER — BALOXAVIR MARBOXIL(80 MG DOSE) 2 X 40 MG PO TBPK: 80.0000 mg | ORAL_TABLET | Freq: Once | ORAL | 0 refills | Status: AC

## 2018-02-28 MED FILL — XOFLUZA 40 (2) MG TBPK: 2 X 40 | 1 days supply | Qty: 2 | Fill #0

## 2018-02-28 NOTE — Patient Instructions (Signed)
Influenza-Like Symptoms, Adult -Take medication as prescribed. -Ibuprofen 800mg  every 8 hours for the next 48 hours pain, fever, or general discomfort. Take this medication with food and water. -Get plenty of rest. -Increase fluids. -Ice to the neck for discomfort for 48 hours then switch to warm moist heat.  -Sleep elevated on at least 2 pillows at bedtime to help with cough if needed. -Use a humidifier or vaporizer when at home and during sleep to help with cough if needed. -Purchase OTC Delsym cough medicine if needed.  Use as directed. -May use a teaspoon of honey or over-the-counter cough drops to help with cough if needed. -I would like you to find a PCP to establish care.  I recommend Dr. Tana Conch at [facility].  You can reach his office at [phone]. -Follow-up if symptoms do not improve.   Influenza, more commonly known as "the flu," is a viral infection that mainly affects the respiratory tract. The respiratory tract includes organs that help you breathe, such as the lungs, nose, and throat. The flu causes many symptoms similar to the common cold along with high fever and body aches. The flu spreads easily from person to person (is contagious). Getting a flu shot (influenza vaccination) every year is the best way to prevent the flu. What are the causes? This condition is caused by the influenza virus. You can get the virus by:  Breathing in droplets that are in the air from an infected person's cough or sneeze.  Touching something that has been exposed to the virus (has been contaminated) and then touching your mouth, nose, or eyes. What increases the risk? The following factors may make you more likely to get the flu:  Not washing or sanitizing your hands often.  Having close contact with many people during cold and flu season.  Touching your mouth, eyes, or nose without first washing or sanitizing your hands.  Not getting a yearly (annual) flu  shot. You may have a higher risk for the flu, including serious problems such as a lung infection (pneumonia), if you:  Are older than 65.  Are pregnant.  Have a weakened disease-fighting system (immune system). You may have a weakened immune system if you: ? Have HIV or AIDS. ? Are undergoing chemotherapy. ? Are taking medicines that reduce (suppress) the activity of your immune system.  Have a long-term (chronic) illness, such as heart disease, kidney disease, diabetes, or lung disease.  Have a liver disorder.  Are severely overweight (morbidly obese).  Have anemia. This is a condition that affects your red blood cells.  Have asthma. What are the signs or symptoms? Symptoms of this condition usually begin suddenly and last 4-14 days. They may include:  Fever and chills.  Headaches, body aches, or muscle aches.  Sore throat.  Cough.  Runny or stuffy (congested) nose.  Chest discomfort.  Poor appetite.  Weakness or fatigue.  Dizziness.  Nausea or vomiting. How is this diagnosed? This condition may be diagnosed based on:  Your symptoms and medical history.  A physical exam.  Swabbing your nose or throat and testing the fluid for the influenza virus. How is this treated? If the flu is diagnosed early, you can be treated with medicine that can help reduce how severe the illness is and how long it lasts (antiviral medicine). This may be given by mouth (orally) or through an IV. Taking care of yourself at home can help relieve symptoms. Your health care provider may recommend:  Taking over-the-counter medicines.  Drinking plenty of fluids. In many cases, the flu goes away on its own. If you have severe symptoms or complications, you may be treated in a hospital. Follow these instructions at home: Activity  Rest as needed and get plenty of sleep.  Stay home from work or school as told by your health care provider. Unless you are visiting your health care  provider, avoid leaving home until your fever has been gone for 24 hours without taking medicine. Eating and drinking  Take an oral rehydration solution (ORS). This is a drink that is sold at pharmacies and retail stores.  Drink enough fluid to keep your urine pale yellow.  Drink clear fluids in small amounts as you are able. Clear fluids include water, ice chips, diluted fruit juice, and low-calorie sports drinks.  Eat bland, easy-to-digest foods in small amounts as you are able. These foods include bananas, applesauce, rice, lean meats, toast, and crackers.  Avoid drinking fluids that contain a lot of sugar or caffeine, such as energy drinks, regular sports drinks, and soda.  Avoid alcohol.  Avoid spicy or fatty foods. General instructions      Take over-the-counter and prescription medicines only as told by your health care provider.  Use a cool mist humidifier to add humidity to the air in your home. This can make it easier to breathe.  Cover your mouth and nose when you cough or sneeze.  Wash your hands with soap and water often, especially after you cough or sneeze. If soap and water are not available, use alcohol-based hand sanitizer.  Keep all follow-up visits as told by your health care provider. This is important. How is this prevented?   Get an annual flu shot. You may get the flu shot in late summer, fall, or winter. Ask your health care provider when you should get your flu shot.  Avoid contact with people who are sick during cold and flu season. This is generally fall and winter. Contact a health care provider if:  You develop new symptoms.  You have: ? Chest pain. ? Diarrhea. ? A fever.  Your cough gets worse.  You produce more mucus.  You feel nauseous or you vomit. Get help right away if:  You develop shortness of breath or difficulty breathing.  Your skin or nails turn a bluish color.  You have severe pain or stiffness in your neck.  You  develop a sudden headache or sudden pain in your face or ear.  You cannot eat or drink without vomiting. Summary  Influenza, more commonly known as "the flu," is a viral infection that primarily affects your respiratory tract.  Symptoms of the flu usually begin suddenly and last 4-14 days.  Getting an annual flu shot is the best way to prevent getting the flu.  Stay home from work or school as told by your health care provider. Unless you are visiting your health care provider, avoid leaving home until your fever has been gone for 24 hours without taking medicine.  Keep all follow-up visits as told by your health care provider. This is important. This information is not intended to replace advice given to you by your health care provider. Make sure you discuss any questions you have with your health care provider. Document Released: 12/16/1999 Document Revised: 06/05/2017 Document Reviewed: 06/05/2017 Elsevier Interactive Patient Education  2019 [employer].

## 2018-02-28 NOTE — Progress Notes (Signed)
MRN: [id number] DOB: 26-Jun-1960  Subjective:   Samuel Sims is a 58 y.o. male presenting for chief complaint of Cough (STARTED THIS MORNING); CHEST CONGESTION (STARTED THIS MORNING); Nasal Congestion (STARTED THIS MORNING); Generalized Body Aches (STARTED THIS MORNING); and Chills (STARTED THIS MORNING) .  Reports several hours history of dry cough and myalgia, chills, fatigue and nasal congestion and chest congestion. Has tried Ibuprofen for relief. Denies sinus headache, sinus congestion , sinus pain, rhinorrhea, sore throat, difficulty swallowing, pain with swallowing, inability to swallow, productive cough, wheezing and shortness of breath, night sweats, weight loss, nausea, vomiting and abdominal pain. Has contact with his son who was diagnosed with influenza. Denies history of seasonal allergies, history of asthma. Patient has had flu shot this season. Denies smoking.. Denies any other aggravating or relieving factors, no other questions or concerns.  Review of Systems  Constitutional: Positive for chills and malaise/fatigue.  HENT: Positive for congestion. Negative for ear discharge, ear pain, sinus pain and sore throat.   Eyes: Negative.   Respiratory: Positive for cough. Negative for sputum production.   Cardiovascular: Negative.   Gastrointestinal: Negative.   Musculoskeletal: Positive for neck pain (stiff neck).  Skin: Negative.   Neurological: Negative.  Negative for headaches.    Messi has a current medication list which includes the following prescription(s): calcium carb-cholecalciferol, ibuprofen, magnesium gluconate, multivitamin, baloxavir marboxil(80 mg dose), hydrocodone-acetaminophen, methocarbamol, and vitamin e. Also is allergic to lincomycin hcl and benadryl [diphenhydramine hcl].  Byntlee  has a past medical history of Complication of anesthesia, GERD (gastroesophageal reflux disease), and History of seasonal allergies. Also  has a past surgical history that includes  Tonsillectomy and Knee arthroscopy (Right, 10/05/2015).   Objective:   Vitals: BP 135/85 (BP Location: Right Arm, Patient Position: Sitting, Cuff Size: Normal)   Pulse 83   Temp 99.1 F (37.3 C) (Oral)   Resp 18   Wt 220 lb 12.8 oz (100.2 kg)   SpO2 95%   BMI 29.95 kg/m   Physical Exam Constitutional:      General: He is not in acute distress. HENT:     Head: Normocephalic.     Right Ear: Tympanic membrane, ear canal and external ear normal.     Left Ear: Tympanic membrane, ear canal and external ear normal.     Nose: Congestion (moderate) and rhinorrhea (clear drainage) present.     Mouth/Throat:     Mouth: Mucous membranes are moist.     Pharynx: No oropharyngeal exudate or posterior oropharyngeal erythema.  Eyes:     Pupils: Pupils are equal, round, and reactive to light.  Neck:     Musculoskeletal: Normal range of motion and neck supple.  Cardiovascular:     Rate and Rhythm: Normal rate and regular rhythm.     Pulses: Normal pulses.     Heart sounds: Normal heart sounds.  Pulmonary:     Effort: Pulmonary effort is normal. No respiratory distress.     Breath sounds: Normal breath sounds. No stridor. No wheezing, rhonchi or rales.  Abdominal:     General: Bowel sounds are normal.     Palpations: Abdomen is soft.     Tenderness: There is no abdominal tenderness.  Lymphadenopathy:     Cervical: No cervical adenopathy.  Neurological:     Mental Status: He is alert.     Assessment and Plan :   Exam findings, diagnosis etiology and medication use and indications reviewed with patient. Follow- Up and discharge instructions provided. No emergent/urgent  issues found on exam.  Based on the patient's clinical presentation, symptoms, sudden onset of symptoms, and known influenza exposure, will go to go ahead and treat the patient with an antiviral.  I am also going to diagnose the patient with influenza-like symptoms without an influenza test as this will not change my course  of treatment.  I am going to go ahead and prescribe Xofluza to the patient for these reasons previously listed.  Patient was also instructed to continue symptomatic treatment to include ibuprofen or Tylenol for pain, fever, or general discomfort, getting plenty of rest, and increasing fluids.  Patient opted to purchase an over-the-counter cough medicine if his symptoms worsened, which I was in agreement with.  The patient is well-appearing, is in no acute distress, and vital signs are stable at this time.  Patient education was provided. Patient verbalized understanding of information provided and agrees with plan of care (POC), all questions answered. The patient is advised to call or return to clinic if condition does not see an improvement in symptoms, or to seek the care of the closest emergency department if condition worsens with the above plan.   1. Influenza-like symptoms  - Baloxavir Marboxil,80 MG Dose, (XOFLUZA) 2 x 40 MG TBPK; Take 80 mg by mouth once for 1 dose.  Dispense: 1 each; Refill: 0 -Take medication as prescribed. -Ibuprofen 800mg  every 8 hours for the next 48 hours pain, fever, or general discomfort. Take this medication with food and water. -Get plenty of rest. -Increase fluids. -Ice to the neck for discomfort for 48 hours then switch to warm moist heat.  -Sleep elevated on at least 2 pillows at bedtime to help with cough if needed. -Use a humidifier or vaporizer when at home and during sleep to help with cough if needed. -Purchase OTC Delsym cough medicine if needed.  Use as directed. -May use a teaspoon of honey or over-the-counter cough drops to help with cough if needed. -I would like you to find a PCP to establish care.  I recommend Dr. Tana Conch at [facility].  You can reach his office at [phone]. -Follow-up if symptoms do not improve.

## 2018-03-03 ENCOUNTER — Telehealth: Payer: Self-pay

## 2018-03-03 NOTE — Telephone Encounter (Signed)
Patient states he was sick on Saturday night  with fever and he felt bad, but on Sunday he stared feeling better and today he feels great.

## 2019-07-06 ENCOUNTER — Encounter: Payer: Self-pay | Admitting: Emergency Medicine

## 2019-07-06 ENCOUNTER — Ambulatory Visit (INDEPENDENT_AMBULATORY_CARE_PROVIDER_SITE_OTHER)
Admission: EM | Admit: 2019-07-06 | Discharge: 2019-07-06 | Disposition: A | Discharge status: Home / Self Care | Payer: 59 | Source: Home / Self Care

## 2019-07-06 ENCOUNTER — Ambulatory Visit (INDEPENDENT_AMBULATORY_CARE_PROVIDER_SITE_OTHER): Payer: 59

## 2019-07-06 ENCOUNTER — Other Ambulatory Visit: Payer: Self-pay

## 2019-07-06 ENCOUNTER — Ambulatory Visit
Admission: RE | Admit: 2019-07-06 | Disposition: A | Discharge status: Home / Self Care | Payer: 59 | Source: Ambulatory Visit

## 2019-07-06 DIAGNOSIS — R2232 Localized swelling, mass and lump, left upper limb: Secondary | ICD-10-CM

## 2019-07-06 DIAGNOSIS — W228XXA Striking against or struck by other objects, initial encounter: Secondary | ICD-10-CM | POA: Diagnosis not present

## 2019-07-06 DIAGNOSIS — M79602 Pain in left arm: Secondary | ICD-10-CM

## 2019-07-06 DIAGNOSIS — M7989 Other specified soft tissue disorders: Secondary | ICD-10-CM | POA: Diagnosis not present

## 2019-07-06 DIAGNOSIS — S59912A Unspecified injury of left forearm, initial encounter: Secondary | ICD-10-CM | POA: Diagnosis not present
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
## 2019-07-06 NOTE — Discharge Instructions (Signed)
X-rays negative for fracture or dislocation Continue conservative management of rest, ice, and elevation Use OTC ibuprofen and/or tylenol as needed for pain Follow up with PCP for further testing/ imaging Return or go to the ER if you have any new or worsening symptoms (fever, chills, chest pain, redness, fever, chills, nausea, vomiting, etc...)
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
## 2019-08-13 DIAGNOSIS — H52223 Regular astigmatism, bilateral: Secondary | ICD-10-CM | POA: Diagnosis not present

## 2020-06-07 ENCOUNTER — Other Ambulatory Visit: Payer: Self-pay

## 2020-06-07 ENCOUNTER — Emergency Department (HOSPITAL_BASED_OUTPATIENT_CLINIC_OR_DEPARTMENT_OTHER): Payer: 59

## 2020-06-07 ENCOUNTER — Encounter (HOSPITAL_BASED_OUTPATIENT_CLINIC_OR_DEPARTMENT_OTHER): Payer: Self-pay | Admitting: Emergency Medicine

## 2020-06-07 ENCOUNTER — Emergency Department (HOSPITAL_BASED_OUTPATIENT_CLINIC_OR_DEPARTMENT_OTHER)
Admission: EM | Admit: 2020-06-07 | Discharge: 2020-06-07 | Disposition: A | Discharge status: Home / Self Care | Payer: 59 | Attending: Emergency Medicine | Admitting: Emergency Medicine

## 2020-06-07 DIAGNOSIS — R1011 Right upper quadrant pain: Secondary | ICD-10-CM | POA: Insufficient documentation

## 2020-06-07 DIAGNOSIS — N200 Calculus of kidney: Secondary | ICD-10-CM | POA: Diagnosis not present

## 2020-06-07 DIAGNOSIS — R197 Diarrhea, unspecified: Secondary | ICD-10-CM | POA: Insufficient documentation

## 2020-06-07 LAB — CBC WITH DIFFERENTIAL/PLATELET
Abs Immature Granulocytes: 0.02 10*3/uL (ref 0.00–0.07)
Basophils Absolute: 0 10*3/uL (ref 0.0–0.1)
Basophils Relative: 0 %
Eosinophils Absolute: 0.1 10*3/uL (ref 0.0–0.5)
Eosinophils Relative: 1 %
HCT: 47.7 % (ref 39.0–52.0)
Hemoglobin: 16.5 g/dL (ref 13.0–17.0)
Immature Granulocytes: 0 %
Lymphocytes Relative: 10 %
Lymphs Abs: 0.8 10*3/uL (ref 0.7–4.0)
MCH: 30.8 pg (ref 26.0–34.0)
MCHC: 34.6 g/dL (ref 30.0–36.0)
MCV: 89.2 fL (ref 80.0–100.0)
Monocytes Absolute: 0.5 10*3/uL (ref 0.1–1.0)
Monocytes Relative: 6 %
Neutro Abs: 6.7 10*3/uL (ref 1.7–7.7)
Neutrophils Relative %: 83 %
Platelets: 205 10*3/uL (ref 150–400)
RBC: 5.35 MIL/uL (ref 4.22–5.81)
RDW: 12.3 % (ref 11.5–15.5)
WBC: 8.1 10*3/uL (ref 4.0–10.5)
nRBC: 0 % (ref 0.0–0.2)

## 2020-06-07 LAB — URINALYSIS, ROUTINE W REFLEX MICROSCOPIC
Bilirubin Urine: NEGATIVE
Glucose, UA: NEGATIVE mg/dL
Hgb urine dipstick: NEGATIVE
Ketones, ur: NEGATIVE mg/dL
Leukocytes,Ua: NEGATIVE
Nitrite: NEGATIVE
Protein, ur: NEGATIVE mg/dL
Specific Gravity, Urine: 1.008 (ref 1.005–1.030)
pH: 6 (ref 5.0–8.0)

## 2020-06-07 LAB — COMPREHENSIVE METABOLIC PANEL
ALT: 25 U/L (ref 0–44)
AST: 21 U/L (ref 15–41)
Albumin: 4.4 g/dL (ref 3.5–5.0)
Alkaline Phosphatase: 52 U/L (ref 38–126)
Anion gap: 9 (ref 5–15)
BUN: 19 mg/dL (ref 6–20)
CO2: 23 mmol/L (ref 22–32)
Calcium: 9 mg/dL (ref 8.9–10.3)
Chloride: 106 mmol/L (ref 98–111)
Creatinine, Ser: 1.11 mg/dL (ref 0.61–1.24)
GFR, Estimated: >60 mL/min (ref >60)
Glucose, Bld: 94 mg/dL (ref 70–99)
Potassium: 4.1 mmol/L (ref 3.5–5.1)
Sodium: 138 mmol/L (ref 135–145)
Total Bilirubin: 1.4 mg/dL — ABNORMAL HIGH (ref 0.3–1.2)
Total Protein: 6.6 g/dL (ref 6.5–8.1)

## 2020-06-07 LAB — LIPASE, BLOOD: Lipase: 21 U/L (ref 11–51)

## 2020-06-07 MED ORDER — SODIUM CHLORIDE 0.9 % IV BOLUS
1000.0000 mL | Freq: Once | INTRAVENOUS | Status: AC
  Administered 2020-06-07: 1000 mL via INTRAVENOUS

## 2020-06-07 MED ORDER — SODIUM CHLORIDE 0.9 % IV SOLN: INTRAVENOUS | Status: DC

## 2020-06-07 NOTE — Discharge Instructions (Addendum)
Work-up today without any acute findings.  Make an appointment with gastroenterology for further evaluation and colonoscopy.  Return for any new or worse symptoms.  If diarrhea reoccurs you could use Imodium A-D over-the-counter.  Review of the CAT scan showed no abnormalities with the gallbladder.  And also showed no other acute findings in the abdomen.

## 2020-06-07 NOTE — ED Triage Notes (Signed)
Intermittent diarrhea x 1 week. sts started as constipation last weel then loose clear stool. Denies NV.

## 2020-06-07 NOTE — ED Provider Notes (Addendum)
[facility] EMERGENCY DEPT Provider Note   CSN: [id number] Arrival date & time: 06/07/20  1032     History Chief Complaint  Patient presents with  . Diarrhea    Samuel Sims is a 60 y.o. male.  Patient with a complaint of diarrhea for a week.  Mostly clear.  Initial onset was with right upper quadrant abdominal pain.  That has resolved.  No prior history of similar problem.  No history of any blood.  The diarrhea has been about 6-7 times a day.  But since patient's been here it has been improving some.  Patient has not been on any antibiotics.  No travel.  No unusual water consumption.  Not associated with fevers.  Not associated with nausea and vomiting        Past Medical History:  Diagnosis Date  . Complication of anesthesia    Dental procedures required more than planned to sedate.  Marland Kitchen GERD (gastroesophageal reflux disease)    past history -used OTC "Prilosec", no problems now  . History of seasonal allergies    more in younger yrs, not in adult years"ragweed mostly"    Patient Active Problem List   Diagnosis Date Noted  . Acute medial meniscal tear 10/05/2015    Past Surgical History:  Procedure Laterality Date  . KNEE ARTHROSCOPY Right 10/05/2015   Procedure: ARTHROSCOPY KNEE WITH MEDIAL MENISCAL DEBRIDEMENT;  Surgeon: Ollen Gross, MD;  Location: [facility];  Service: Orthopedics;  Laterality: Right;  . TONSILLECTOMY         Family History  Problem Relation Age of Onset  . Hypertension Mother     Social History   Tobacco Use  . Smoking status: Never Smoker  . Smokeless tobacco: Never Used  Substance Use Topics  . Alcohol use: Yes    Comment: socialy  . Drug use: No    Home Medications Prior to Admission medications   Medication Sig Start Date End Date Taking? Authorizing Provider  Calcium Carb-Cholecalciferol (CALCIUM 500 + D3 PO) Take 1 tablet by mouth daily. 500 mg D3    [provider]  ibuprofen (ADVIL,MOTRIN) 800 MG  tablet Take 1 tablet (800 mg total) by mouth 3 (three) times daily. 01/31/16   Linna Hoff, MD  magnesium gluconate (MAGONATE) 500 MG tablet Take 500 mg by mouth daily.    [provider]  Multiple Vitamin (MULTIVITAMIN) tablet Take 1 tablet by mouth daily.    [provider]  vitamin E 400 UNIT capsule Take 400 Units by mouth daily.    [provider]    Allergies    Lincomycin hcl and Benadryl [diphenhydramine hcl]  Review of Systems   Review of Systems  Constitutional: Negative for chills and fever.  HENT: Negative for rhinorrhea and sore throat.   Eyes: Negative for visual disturbance.  Respiratory: Negative for cough and shortness of breath.   Cardiovascular: Negative for chest pain and leg swelling.  Gastrointestinal: Positive for abdominal pain and diarrhea. Negative for nausea and vomiting.  Genitourinary: Negative for dysuria.  Musculoskeletal: Negative for back pain and neck pain.  Skin: Negative for rash.  Neurological: Negative for dizziness, light-headedness and headaches.  Hematological: Does not bruise/bleed easily.  Psychiatric/Behavioral: Negative for confusion.    Physical Exam Updated Vital Signs BP (!) 145/91   Pulse 71   Temp 98.7 F (37.1 C) (Oral)   Resp 16   Ht 1.829 m (6')   Wt 97.5 kg   SpO2 99%   BMI  29.16 kg/m   Physical Exam Vitals and nursing note reviewed.  Constitutional:      Appearance: Normal appearance. He is well-developed.  HENT:     Head: Normocephalic and atraumatic.  Eyes:     Extraocular Movements: Extraocular movements intact.     Conjunctiva/sclera: Conjunctivae normal.     Pupils: Pupils are equal, round, and reactive to light.  Cardiovascular:     Rate and Rhythm: Normal rate and regular rhythm.     Heart sounds: No murmur heard.   Pulmonary:     Effort: Pulmonary effort is normal. No respiratory distress.     Breath sounds: Normal breath sounds.  Abdominal:     General: There is no  distension.     Palpations: Abdomen is soft.     Tenderness: There is no abdominal tenderness. There is no guarding.  Musculoskeletal:        General: Normal range of motion.     Cervical back: Normal range of motion and neck supple.  Skin:    General: Skin is warm and dry.  Neurological:     General: No focal deficit present.     Mental Status: He is alert and oriented to person, place, and time.     Cranial Nerves: No cranial nerve deficit.     Sensory: No sensory deficit.     Motor: No weakness.     ED Results / Procedures / Treatments   Labs (all labs ordered are listed, but only abnormal results are displayed) Labs Reviewed  COMPREHENSIVE METABOLIC PANEL - Abnormal; Notable for the following components:      Result Value   Total Bilirubin 1.4 (*)    All other components within normal limits  URINALYSIS, ROUTINE W REFLEX MICROSCOPIC - Abnormal; Notable for the following components:   Color, Urine COLORLESS (*)    All other components within normal limits  LIPASE, BLOOD  CBC WITH DIFFERENTIAL/PLATELET    EKG None  Radiology CT Abdomen Pelvis Wo Contrast  Result Date: 06/07/2020 CLINICAL DATA:  Bilateral lower quadrant abdominal pain and diarrhea. EXAM: CT ABDOMEN AND PELVIS WITHOUT CONTRAST TECHNIQUE: Multidetector CT imaging of the abdomen and pelvis was performed following the standard protocol without IV contrast. COMPARISON:  None. FINDINGS: Lower chest: Unremarkable. Hepatobiliary: Confluent, confluent patchy areas of low density in the liver with geographical areas of sparing. The sparing is most prominent adjacent to the gallbladder fossa. Normal appearing gallbladder. Pancreas: Unremarkable. No pancreatic ductal dilatation or surrounding inflammatory changes. Spleen: Normal in size without focal abnormality. Adrenals/Urinary Tract: Tiny upper pole right renal calculus. Otherwise, unremarkable kidneys, ureters, urinary bladder and adrenal glands. Stomach/Bowel: Stomach  is within normal limits. Appendix appears normal. No evidence of bowel wall thickening, distention, or inflammatory changes. Minimal sigmoid colon diverticulosis. Vascular/Lymphatic: Atheromatous arterial calcifications without aneurysm. No enlarged lymph nodes. Reproductive: Prostate is unremarkable. Other: Small left inguinal hernia containing fat. Tiny umbilical hernia containing fat. Musculoskeletal: Lumbar and lower thoracic spine degenerative changes. IMPRESSION: 1. No acute abnormality. 2. Hepatic steatosis with areas of sparing. 3. Minimal sigmoid colon diverticulosis. 4. Tiny, nonobstructing upper pole right renal calculus. Electronically Signed   By: Beckie Salts M.D.   On: 06/07/2020 14:03    Procedures Procedures   Medications Ordered in ED Medications  0.9 %  sodium chloride infusion ( Intravenous New Bag/Given 06/07/20 1452)  sodium chloride 0.9 % bolus 1,000 mL (0 mLs Intravenous Stopped 06/07/20 1451)    ED Course  I have reviewed the triage vital signs  and the nursing notes.  Pertinent labs & imaging results that were available during my care of the patient were reviewed by me and considered in my medical decision making (see chart for details).    MDM Rules/Calculators/A&P                          Work-up for the right upper quadrant abdominal pain which has resolved.  And the diarrhea without any acute findings.  Patient states that she has had very little diarrhea today.  CT scan of the abdomen showed no abnormalities of the gallbladder.  And small intestines and large intestines were normal.  Labs also normal.  No leukocytosis.  Urinalysis is normal.  Liver function test showed a mild elevation in the bilirubin at 1.4.  But otherwise they were normal.  And lipase was normal.  Will refer patient to gastroenterology.  Colonoscopy would be appropriate both for his age and for this symptom onset.  Is possible that it could have been a form of food poisoning.  Patient stable for  discharge home.  And follow-up with gastroenterology. Final Clinical Impression(s) / ED Diagnoses Final diagnoses:  Diarrhea, unspecified type  Right upper quadrant abdominal pain    Rx / DC Orders ED Discharge Orders    None       Vanetta Mulders, MD 06/07/20 1559    Vanetta Mulders, MD 06/07/20 1600

## 2020-06-13 DIAGNOSIS — K5730 Diverticulosis of large intestine without perforation or abscess without bleeding: Secondary | ICD-10-CM | POA: Diagnosis not present

## 2020-06-13 DIAGNOSIS — R194 Change in bowel habit: Secondary | ICD-10-CM | POA: Diagnosis not present

## 2020-06-13 DIAGNOSIS — R1011 Right upper quadrant pain: Secondary | ICD-10-CM | POA: Diagnosis not present

## 2020-06-13 DIAGNOSIS — R197 Diarrhea, unspecified: Secondary | ICD-10-CM | POA: Diagnosis not present

## 2020-06-13 DIAGNOSIS — K760 Fatty (change of) liver, not elsewhere classified: Secondary | ICD-10-CM | POA: Diagnosis not present

## 2021-05-09 ENCOUNTER — Ambulatory Visit (HOSPITAL_BASED_OUTPATIENT_CLINIC_OR_DEPARTMENT_OTHER)
Admission: RE | Admit: 2021-05-09 | Discharge: 2021-05-09 | Disposition: A | Discharge status: Home / Self Care | Payer: 59 | Source: Ambulatory Visit | Attending: Family | Admitting: Family

## 2021-05-09 ENCOUNTER — Ambulatory Visit: Payer: 59 | Admitting: Family

## 2021-05-09 ENCOUNTER — Encounter: Payer: Self-pay | Admitting: Family

## 2021-05-09 ENCOUNTER — Other Ambulatory Visit (HOSPITAL_COMMUNITY): Payer: Self-pay

## 2021-05-09 VITALS — BP 148/91 | HR 60 | Temp 98.3°F | Resp 16 | Ht 70.0 in | Wt 214.0 lb

## 2021-05-09 DIAGNOSIS — M79641 Pain in right hand: Secondary | ICD-10-CM

## 2021-05-09 DIAGNOSIS — K5909 Other constipation: Secondary | ICD-10-CM | POA: Diagnosis not present

## 2021-05-09 DIAGNOSIS — R109 Unspecified abdominal pain: Secondary | ICD-10-CM | POA: Diagnosis not present

## 2021-05-09 DIAGNOSIS — R030 Elevated blood-pressure reading, without diagnosis of hypertension: Secondary | ICD-10-CM | POA: Diagnosis not present

## 2021-05-09 DIAGNOSIS — M25531 Pain in right wrist: Secondary | ICD-10-CM

## 2021-05-09 DIAGNOSIS — L03113 Cellulitis of right upper limb: Secondary | ICD-10-CM

## 2021-05-09 MED ORDER — CEPHALEXIN 500 MG PO CAPS
500.0000 mg | ORAL_CAPSULE | Freq: Three times a day (TID) | ORAL | 0 refills | Status: DC
  Filled 2021-05-09: qty 21, 7d supply, fill #0

## 2021-05-09 NOTE — Assessment & Plan Note (Signed)
?   Reduced LIH.  He also has a tender firm subcutaneous nodule RUQ. ? Hematoma. Obtain CT for further evaluation once we have completed cmet to assess renal function.  ?

## 2021-05-09 NOTE — Assessment & Plan Note (Signed)
New. Has been present x >1 month. Will rx with keflex.  ?
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
## 2021-05-09 NOTE — Assessment & Plan Note (Signed)
Reports hx of white coat htn.  Will plan to recheck in 1 month at his cpx.  He will check his bp at home the morning of his appointment and bring that reading with him.  ? ?BP Readings from Last 3 Encounters:  ?05/09/21 (!) 148/91  ?06/07/20 (!) 145/91  ?02/28/18 135/85  ? ? ?

## 2021-05-09 NOTE — Patient Instructions (Addendum)
Please complete lab work prior to leaving. ?Complete x-ray of your hand/wrist on the first floor in imaging. ?You should be contacted about scheduling your CT scan. ?Start keflex (antibiotic) for the wound infection on your right wrist.  ?Welcome to [facility]! ?

## 2021-05-09 NOTE — Assessment & Plan Note (Signed)
Encouraged him to increase his water intake and focus on healthier food choices on the days he works.  ?

## 2021-05-10 LAB — COMPREHENSIVE METABOLIC PANEL
ALT: 27 U/L (ref 0–53)
AST: 21 U/L (ref 0–37)
Albumin: 4.6 g/dL (ref 3.5–5.2)
Alkaline Phosphatase: 60 U/L (ref 39–117)
BUN: 24 mg/dL — ABNORMAL HIGH (ref 6–23)
CO2: 25 mEq/L (ref 19–32)
Calcium: 9.4 mg/dL (ref 8.4–10.5)
Chloride: 104 mEq/L (ref 96–112)
Creatinine, Ser: 1.24 mg/dL (ref 0.40–1.50)
GFR: 64.12 mL/min (ref >60.00)
Glucose, Bld: 88 mg/dL (ref 70–99)
Potassium: 4.3 mEq/L (ref 3.5–5.1)
Sodium: 137 mEq/L (ref 135–145)
Total Bilirubin: 1.2 mg/dL (ref 0.2–1.2)
Total Protein: 6.7 g/dL (ref 6.0–8.3)

## 2021-05-17 ENCOUNTER — Ambulatory Visit (HOSPITAL_BASED_OUTPATIENT_CLINIC_OR_DEPARTMENT_OTHER)
Admission: RE | Admit: 2021-05-17 | Discharge: 2021-05-17 | Disposition: A | Discharge status: Home / Self Care | Payer: 59 | Source: Ambulatory Visit | Attending: Family | Admitting: Family

## 2021-05-17 ENCOUNTER — Encounter (HOSPITAL_BASED_OUTPATIENT_CLINIC_OR_DEPARTMENT_OTHER): Payer: Self-pay

## 2021-05-17 DIAGNOSIS — R109 Unspecified abdominal pain: Secondary | ICD-10-CM | POA: Insufficient documentation

## 2021-05-17 DIAGNOSIS — I700 Atherosclerosis of aorta: Secondary | ICD-10-CM | POA: Diagnosis not present

## 2021-05-17 MED ORDER — IOHEXOL 300 MG/ML  SOLN
100.0000 mL | Freq: Once | INTRAMUSCULAR | Status: AC | PRN
  Administered 2021-05-17: 100 mL via INTRAVENOUS

## 2021-08-13 ENCOUNTER — Telehealth: Payer: 59 | Admitting: Urgent Care

## 2021-08-13 DIAGNOSIS — J22 Unspecified acute lower respiratory infection: Secondary | ICD-10-CM | POA: Diagnosis not present

## 2021-08-13 MED ORDER — PROMETHAZINE-DM 6.25-15 MG/5ML PO SYRP: 5.0000 mL | ORAL_SOLUTION | Freq: Four times a day (QID) | ORAL | 0 refills | Status: AC | PRN

## 2021-08-13 MED ORDER — AZITHROMYCIN 250 MG PO TABS
ORAL_TABLET | ORAL | 0 refills | Status: AC
Start: 2021-08-13 — End: 2021-08-18

## 2021-08-13 NOTE — Patient Instructions (Signed)
  Samuel Sims, thank you for joining Maretta Bees, PA for today's virtual visit.  While this provider is not your primary care provider (PCP), if your PCP is located in our provider database this encounter information will be shared with them immediately following your visit.  Consent: (Patient) Samuel Sims provided verbal consent for this virtual visit at the beginning of the encounter.  Current Medications:  Current Outpatient Medications:    azithromycin (ZITHROMAX) 250 MG tablet, Take 2 tablets on day 1, then 1 tablet daily on days 2 through 5, Disp: 6 tablet, Rfl: 0   promethazine-dextromethorphan (PROMETHAZINE-DM) 6.25-15 MG/5ML syrup, Take 5 mLs by mouth 4 (four) times daily as needed for cough. Sedation precaution, Disp: 118 mL, Rfl: 0   calcium carbonate (TUMS - DOSED IN MG ELEMENTAL CALCIUM) 500 MG chewable tablet, Chew 1 tablet by mouth daily., Disp: , Rfl:    Medications ordered in this encounter:  Meds ordered this encounter  Medications   azithromycin (ZITHROMAX) 250 MG tablet    Sig: Take 2 tablets on day 1, then 1 tablet daily on days 2 through 5    Dispense:  6 tablet    Refill:  0    Order Specific Question:   Supervising Provider    Answer:   Merrilee Jansky [[id number]]   promethazine-dextromethorphan (PROMETHAZINE-DM) 6.25-15 MG/5ML syrup    Sig: Take 5 mLs by mouth 4 (four) times daily as needed for cough. Sedation precaution    Dispense:  118 mL    Refill:  0    Order Specific Question:   Supervising Provider    Answer:   Merrilee Jansky [[id number]]     *If you need refills on other medications prior to your next appointment, please contact your pharmacy*  Follow-Up: Call back or seek an in-person evaluation if the symptoms worsen or if the condition fails to improve as anticipated.  Other Instructions Start taking azithromycin today, as prescribed per package instructions. Please only use the cough medication at night to help you sleep. During  the day you can continue mucinex (guaifenesin) 600mg  twice daily, please do not use the one with dextromethorphan or phenylephrine. Purchase a warm mist vaporizer for use at nighttime, or use steam from shower to help break up the mucous in your chest. Eucalyptus or Vicks can also help open up your airway. Drink plenty of water and rest. If your symptoms persist, or if new symptoms develop, please be seen in person.   If you have been instructed to have an in-person evaluation today at a local Urgent Care facility, please use the link below. It will take you to a list of all of our available [facility], including address, phone number and hours of operation. Please do not delay care.  [facility]  If you or a family member do not have a primary care provider, use the link below to schedule a visit and establish care. When you choose a [facility] primary care physician or advanced practice provider, you gain a long-term partner in health. Find a Primary Care Provider  Learn more about [facility] in-office and virtual care options: [facility] - Get Care Now

## 2021-08-13 NOTE — Progress Notes (Unsigned)
Virtual Visit Consent   Samuel Sims, you are scheduled for a virtual visit with a [facility] provider today. Just as with appointments in the office, your consent must be obtained to participate. Your consent will be active for this visit and any virtual visit you may have with one of our providers in the next 365 days. If you have a MyChart account, a copy of this consent can be sent to you electronically.  As this is a virtual visit, video technology does not allow for your provider to perform a traditional examination. This may limit your provider's ability to fully assess your condition. If your provider identifies any concerns that need to be evaluated in person or the need to arrange testing (such as labs, EKG, etc.), we will make arrangements to do so. Although advances in technology are sophisticated, we cannot ensure that it will always work on either your end or our end. If the connection with a video visit is poor, the visit may have to be switched to a telephone visit. With either a video or telephone visit, we are not always able to ensure that we have a secure connection.  By engaging in this virtual visit, you consent to the provision of healthcare and authorize for your insurance to be billed (if applicable) for the services provided during this visit. Depending on your insurance coverage, you may receive a charge related to this service.  I need to obtain your verbal consent now. Are you willing to proceed with your visit today? Samuel Sims has provided verbal consent on 08/13/2021 for a virtual visit (video or telephone). Maretta Bees, PA  Date: 08/13/2021 3:25 PM  Virtual Visit via Video Note   I, Adley Castello L Alleya Demeter, connected with  Samuel Sims  ([id number], 06/30/1960) on 08/13/21 at  3:00 PM EDT by a video-enabled telemedicine application and verified that I am speaking with the correct person using two identifiers.  Location: Patient: Virtual Visit Location  Patient: Home Provider: Virtual Visit Location Provider: Home Office   I discussed the limitations of evaluation and management by telemedicine and the availability of in person appointments. The patient expressed understanding and agreed to proceed.    History of Present Illness: Samuel Sims is a 61 y.o.  male is being seen today for cough.  HPI: Pt presents on day 10 of cough and lower respiratory tract symptoms. Started on 08/03/21 with body aches, joint pain, headaches, productive cough of thick green phlegm. Did have fever initially as well, but states this resolved several days ago. Took a covid test which was reportedly negative. Pt denies SOB at rest, but endorses SOB during paroxysm of coughing. Denies CP, palpitations. Some sinus congestion, but denies sinus pain. He has no chronic medical conditions and takes no daily medications. Has been trying OTC generic mucinex, allergy medication without relief. Laying on left side at night has helped some. Wife with similar symptoms, was seen yesterday and given a Zpack. She has completed two doses and is feeling better. Pt hoping for same tx. No allergy to this medication.    Problems:  Patient Active Problem List   Diagnosis Date Noted   Abdominal pain 05/09/2021   Right hand pain 05/09/2021   Right wrist pain 05/09/2021   Cellulitis of right wrist 05/09/2021   Elevated blood pressure reading 05/09/2021   Intermittent constipation 05/09/2021   Acute medial meniscal tear 10/05/2015    Allergies:  Allergies  Allergen Reactions   Lincomycin  Hcl     Pt reports syncopal episode after received Lincocin. No allergy to PCN.    Benadryl [Diphenhydramine Hcl] Rash    "eyelids broke out in rash"   Medications:  Current Outpatient Medications:    azithromycin (ZITHROMAX) 250 MG tablet, Take 2 tablets on day 1, then 1 tablet daily on days 2 through 5, Disp: 6 tablet, Rfl: 0   promethazine-dextromethorphan (PROMETHAZINE-DM) 6.25-15 MG/5ML  syrup, Take 5 mLs by mouth 4 (four) times daily as needed for cough. Sedation precaution, Disp: 118 mL, Rfl: 0   calcium carbonate (TUMS - DOSED IN MG ELEMENTAL CALCIUM) 500 MG chewable tablet, Chew 1 tablet by mouth daily., Disp: , Rfl:   Observations/Objective: Patient is well-developed, well-nourished in no acute distress.  Resting at home, NAD, non toxic, but acutely ill.  Head is normocephalic, atraumatic.  No labored breathing. Harsh coughing intermittently. Speech is clear and coherent with logical content.  Patient is alert and oriented at baseline.  No scleral icterus, coryza or rhinorrhea. No visible rash.  Assessment and Plan: 1. Lower respiratory tract infection  Pt on day 10, home covid test negative. Has failed supportive measures. Wife with same illness, on day two of azithromycin and seeing significant improvement. Will do cough medication for use only at night, zpack as prescribed. F/U for in person eval should symptoms persist.  Follow Up Instructions: I discussed the assessment and treatment plan with the patient. The patient was provided an opportunity to ask questions and all were answered. The patient agreed with the plan and demonstrated an understanding of the instructions.  A copy of instructions were sent to the patient via MyChart unless otherwise noted below.   The patient was advised to call back or seek an in-person evaluation if the symptoms worsen or if the condition fails to improve as anticipated.  Time:  I spent 8 minutes with the patient via telehealth technology discussing the above problems/concerns.    Kaysi Ourada L Remmie Bembenek, PA

## 2022-03-29 DIAGNOSIS — M542 Cervicalgia: Secondary | ICD-10-CM | POA: Diagnosis not present

## 2022-03-29 DIAGNOSIS — M5451 Vertebrogenic low back pain: Secondary | ICD-10-CM | POA: Diagnosis not present

## 2022-03-29 DIAGNOSIS — M9903 Segmental and somatic dysfunction of lumbar region: Secondary | ICD-10-CM | POA: Diagnosis not present

## 2022-03-29 DIAGNOSIS — M9901 Segmental and somatic dysfunction of cervical region: Secondary | ICD-10-CM | POA: Diagnosis not present

## 2022-04-03 DIAGNOSIS — M9901 Segmental and somatic dysfunction of cervical region: Secondary | ICD-10-CM | POA: Diagnosis not present

## 2022-04-03 DIAGNOSIS — M542 Cervicalgia: Secondary | ICD-10-CM | POA: Diagnosis not present

## 2022-04-03 DIAGNOSIS — M9903 Segmental and somatic dysfunction of lumbar region: Secondary | ICD-10-CM | POA: Diagnosis not present

## 2022-04-03 DIAGNOSIS — M5451 Vertebrogenic low back pain: Secondary | ICD-10-CM | POA: Diagnosis not present

## 2022-04-04 DIAGNOSIS — M5451 Vertebrogenic low back pain: Secondary | ICD-10-CM | POA: Diagnosis not present

## 2022-04-04 DIAGNOSIS — M542 Cervicalgia: Secondary | ICD-10-CM | POA: Diagnosis not present

## 2022-04-04 DIAGNOSIS — M9901 Segmental and somatic dysfunction of cervical region: Secondary | ICD-10-CM | POA: Diagnosis not present

## 2022-04-04 DIAGNOSIS — M9903 Segmental and somatic dysfunction of lumbar region: Secondary | ICD-10-CM | POA: Diagnosis not present

## 2022-04-05 DIAGNOSIS — M9901 Segmental and somatic dysfunction of cervical region: Secondary | ICD-10-CM | POA: Diagnosis not present

## 2022-04-05 DIAGNOSIS — M5451 Vertebrogenic low back pain: Secondary | ICD-10-CM | POA: Diagnosis not present

## 2022-04-05 DIAGNOSIS — M9903 Segmental and somatic dysfunction of lumbar region: Secondary | ICD-10-CM | POA: Diagnosis not present

## 2022-04-05 DIAGNOSIS — M542 Cervicalgia: Secondary | ICD-10-CM | POA: Diagnosis not present

## 2022-04-09 DIAGNOSIS — M9901 Segmental and somatic dysfunction of cervical region: Secondary | ICD-10-CM | POA: Diagnosis not present

## 2022-04-09 DIAGNOSIS — M5451 Vertebrogenic low back pain: Secondary | ICD-10-CM | POA: Diagnosis not present

## 2022-04-09 DIAGNOSIS — M9903 Segmental and somatic dysfunction of lumbar region: Secondary | ICD-10-CM | POA: Diagnosis not present

## 2022-04-09 DIAGNOSIS — M542 Cervicalgia: Secondary | ICD-10-CM | POA: Diagnosis not present

## 2022-04-11 DIAGNOSIS — M9901 Segmental and somatic dysfunction of cervical region: Secondary | ICD-10-CM | POA: Diagnosis not present

## 2022-04-11 DIAGNOSIS — M5451 Vertebrogenic low back pain: Secondary | ICD-10-CM | POA: Diagnosis not present

## 2022-04-11 DIAGNOSIS — M9903 Segmental and somatic dysfunction of lumbar region: Secondary | ICD-10-CM | POA: Diagnosis not present

## 2022-04-11 DIAGNOSIS — M542 Cervicalgia: Secondary | ICD-10-CM | POA: Diagnosis not present

## 2022-04-17 DIAGNOSIS — M5451 Vertebrogenic low back pain: Secondary | ICD-10-CM | POA: Diagnosis not present

## 2022-04-17 DIAGNOSIS — M542 Cervicalgia: Secondary | ICD-10-CM | POA: Diagnosis not present

## 2022-04-17 DIAGNOSIS — M9903 Segmental and somatic dysfunction of lumbar region: Secondary | ICD-10-CM | POA: Diagnosis not present

## 2022-04-17 DIAGNOSIS — M9901 Segmental and somatic dysfunction of cervical region: Secondary | ICD-10-CM | POA: Diagnosis not present

## 2022-04-19 DIAGNOSIS — M5451 Vertebrogenic low back pain: Secondary | ICD-10-CM | POA: Diagnosis not present

## 2022-04-19 DIAGNOSIS — M9901 Segmental and somatic dysfunction of cervical region: Secondary | ICD-10-CM | POA: Diagnosis not present

## 2022-04-19 DIAGNOSIS — M542 Cervicalgia: Secondary | ICD-10-CM | POA: Diagnosis not present

## 2022-04-19 DIAGNOSIS — M9903 Segmental and somatic dysfunction of lumbar region: Secondary | ICD-10-CM | POA: Diagnosis not present

## 2022-04-24 DIAGNOSIS — M9903 Segmental and somatic dysfunction of lumbar region: Secondary | ICD-10-CM | POA: Diagnosis not present

## 2022-04-24 DIAGNOSIS — M9901 Segmental and somatic dysfunction of cervical region: Secondary | ICD-10-CM | POA: Diagnosis not present

## 2022-04-24 DIAGNOSIS — M5451 Vertebrogenic low back pain: Secondary | ICD-10-CM | POA: Diagnosis not present

## 2022-04-24 DIAGNOSIS — M542 Cervicalgia: Secondary | ICD-10-CM | POA: Diagnosis not present

## 2022-10-05 ENCOUNTER — Other Ambulatory Visit: Payer: Self-pay | Admitting: Family

## 2022-10-05 DIAGNOSIS — Z1212 Encounter for screening for malignant neoplasm of rectum: Secondary | ICD-10-CM

## 2022-10-05 DIAGNOSIS — Z1211 Encounter for screening for malignant neoplasm of colon: Secondary | ICD-10-CM

## 2023-01-06 ENCOUNTER — Telehealth: Payer: Self-pay | Admitting: Family

## 2023-01-06 NOTE — Telephone Encounter (Signed)
 See Letter.

## 2023-01-08 NOTE — Telephone Encounter (Signed)
Mailed out to patient 

## 2023-01-08 NOTE — Telephone Encounter (Signed)
 Mailed out to pt
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
## 2023-03-26 IMAGING — CT CT ABD-PELV W/ CM
2 of 5 series · 16 of 46 positions shown, 18 images · IV contrast (agent unspecified)
Comparison: CT abdomen and pelvis 06/07/2020

CLINICAL DATA: Left lower quadrant pain

EXAM:
CT ABDOMEN AND PELVIS WITH CONTRAST
TECHNIQUE: Multidetector CT imaging of the abdomen and pelvis was performed
using the standard protocol following bolus administration of
intravenous contrast.

[Series 2: axial st · axial · 0.96mm/px · z∈[-602,-107]mm · 13 of 111 slices shown, 15 images]
[im 6/111  soft-tissue]
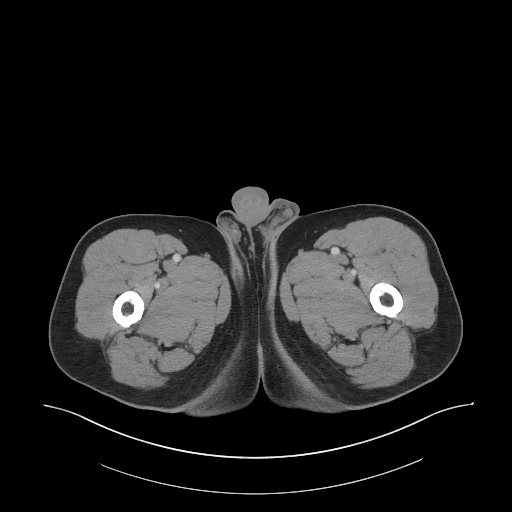
[im 6/111  bone]
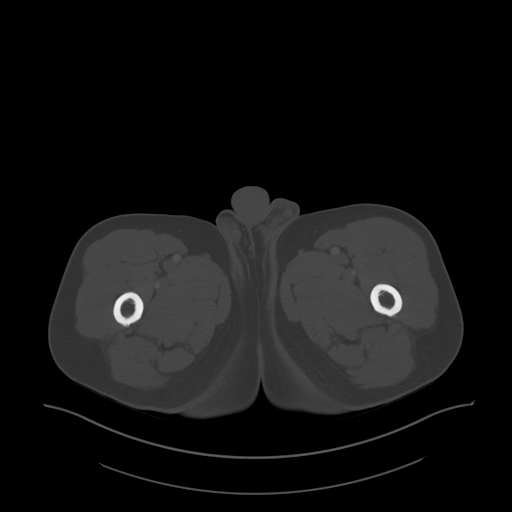
[im 18/111  soft-tissue]
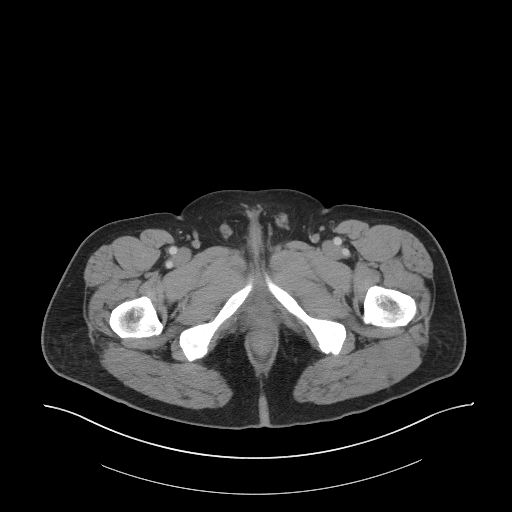
[im 24/111  soft-tissue]
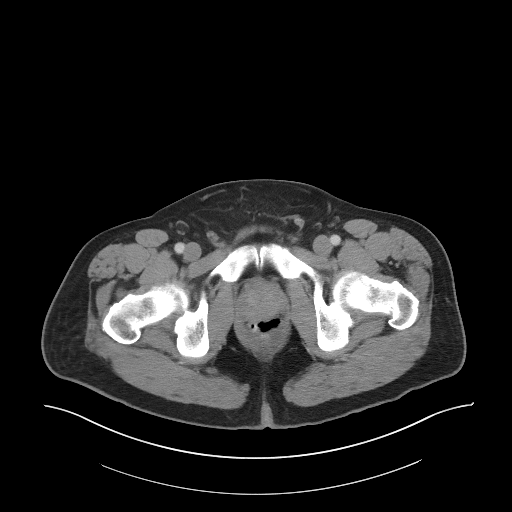
[im 29/111  soft-tissue]
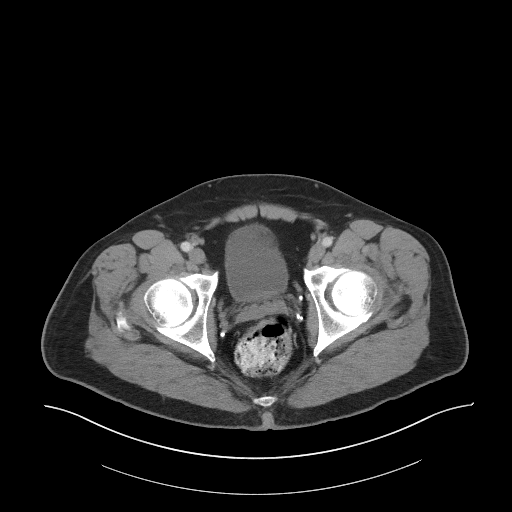
[im 41/111  soft-tissue]
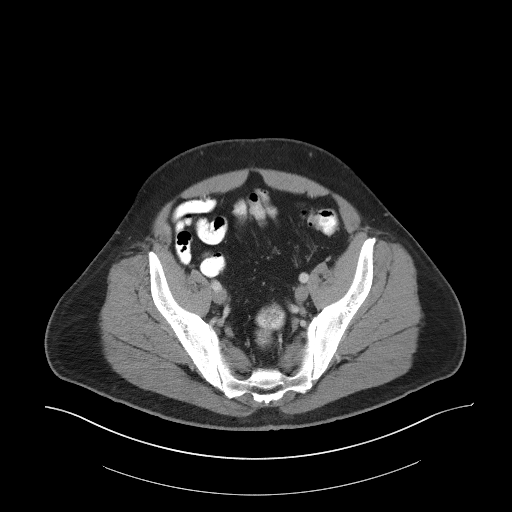
[im 47/111  soft-tissue]
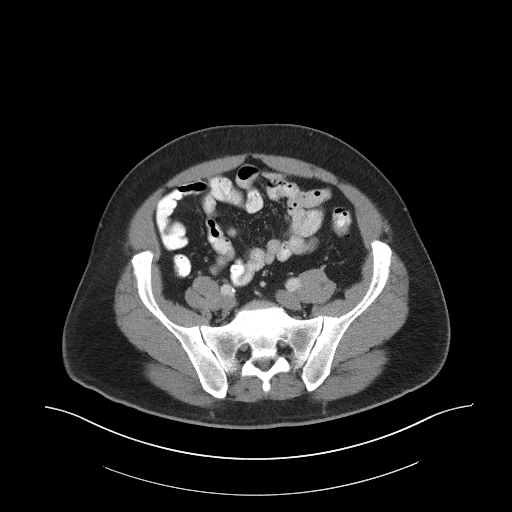
[im 58/111  soft-tissue]
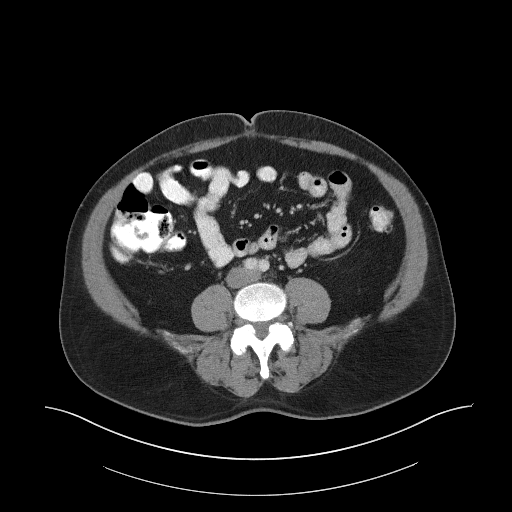
[im 64/111  soft-tissue]
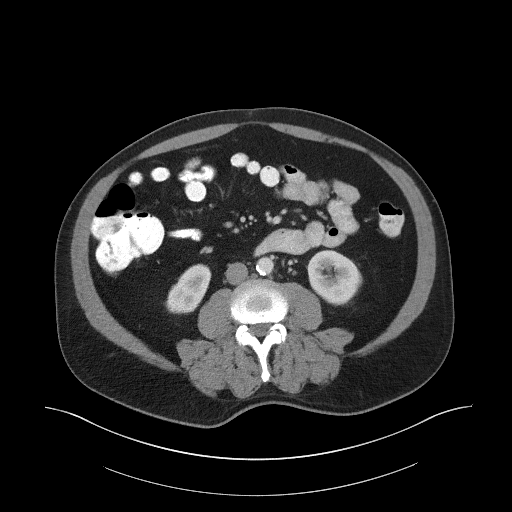
[im 70/111  soft-tissue]
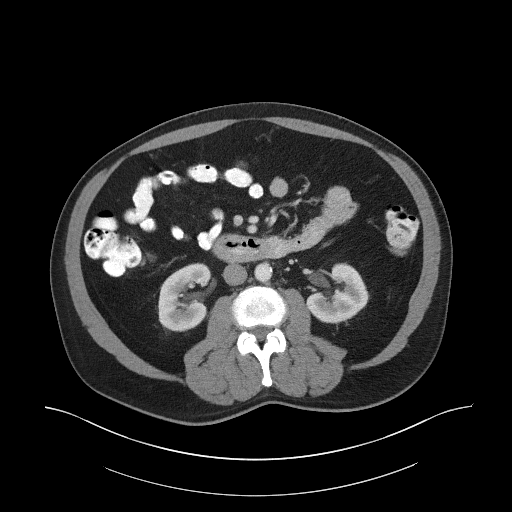
[im 70/111  bone]
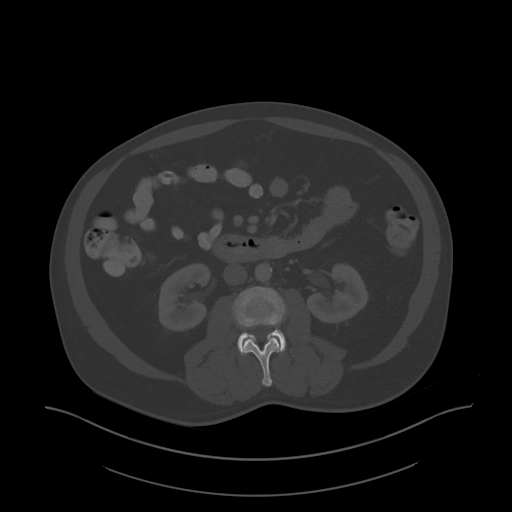
[im 82/111  soft-tissue]
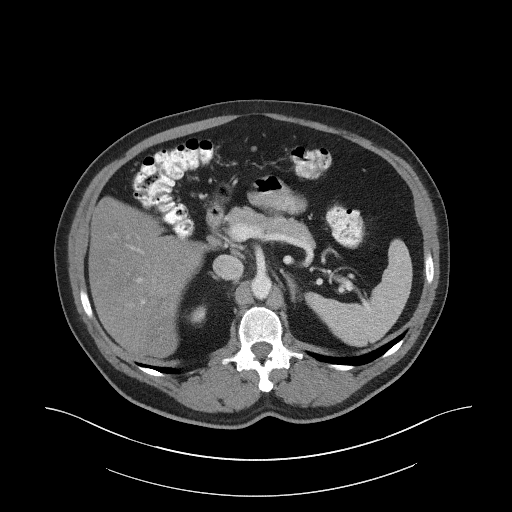
[im 87/111  soft-tissue]
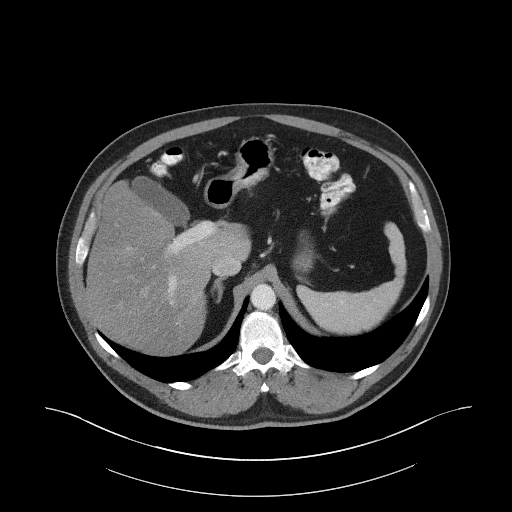
[im 93/111  soft-tissue]
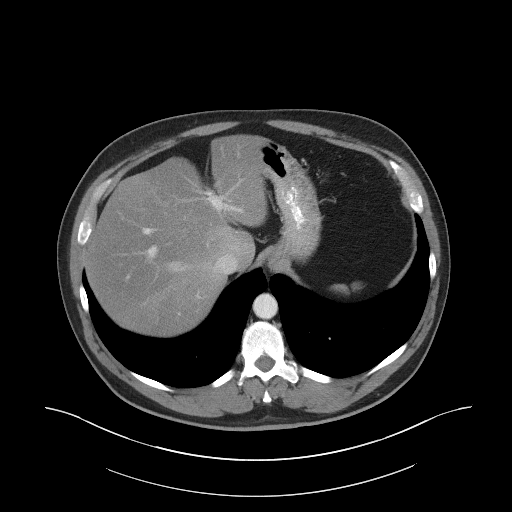
[im 105/111  soft-tissue]
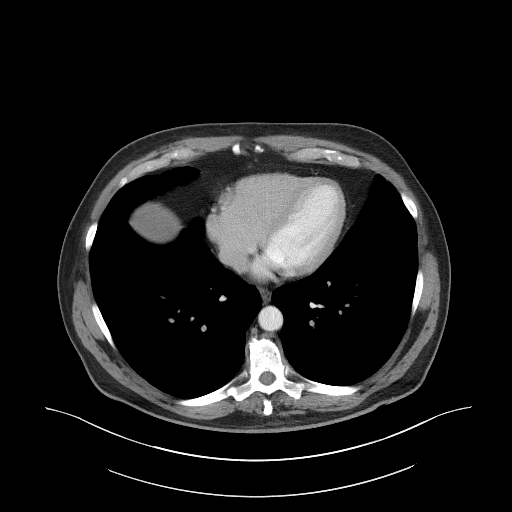

[Series 5: coronal st · coronal · 0.85mm/px · 3 of 106 slices shown]
[im 36/106  soft-tissue]
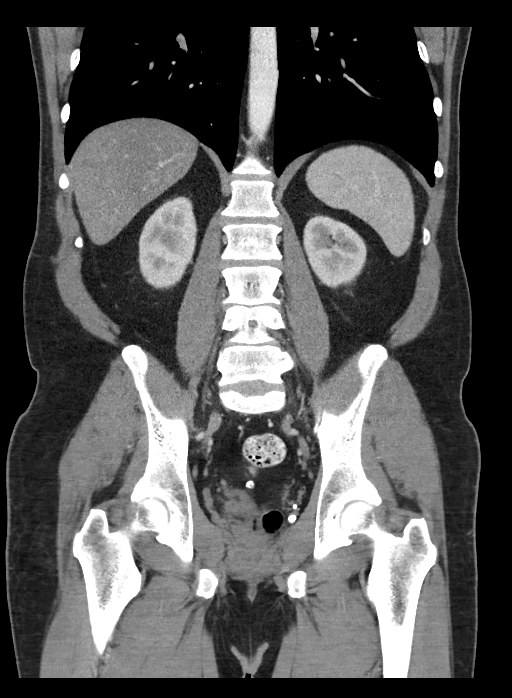
[im 47/106  soft-tissue]
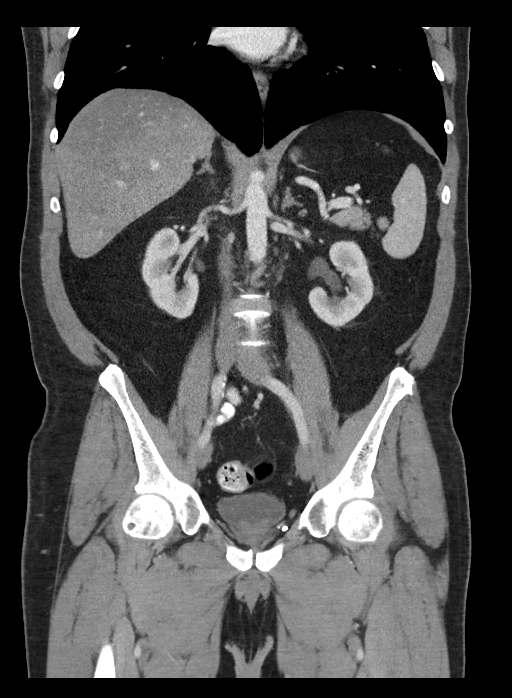
[im 59/106  soft-tissue]
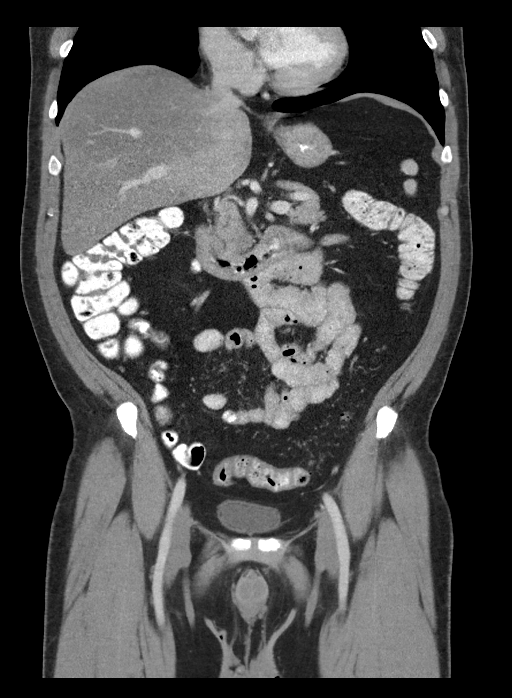

[16 of 46 positions shown; findings below may reference images not displayed]

RADIATION DOSE REDUCTION: This exam was performed according to the
departmental dose-optimization program which includes automated
exposure control, adjustment of the mA and/or kV according to
patient size and/or use of iterative reconstruction technique.

CONTRAST:  100mL OMNIPAQUE IOHEXOL 300 MG/ML  SOLN
FINDINGS: Lower chest: No acute abnormality.

Hepatobiliary: Liver is normal in size with diffuse heterogeneous
hypodense parenchyma consistent with hepatic steatosis. No focal
mass visualized. Gallbladder appears normal. No biliary ductal
dilatation.

Pancreas: Unremarkable. No pancreatic ductal dilatation or
surrounding inflammatory changes.

Spleen: Normal in size without focal abnormality.

Adrenals/Urinary Tract: Adrenal glands are unremarkable. Kidneys are
normal, without renal calculi, focal lesion, or hydronephrosis.
Bladder is unremarkable.

Stomach/Bowel: No bowel obstruction, free air or pneumatosis. Note
is made of circumferential relative wall thickening of the third
portion of the duodenum which appears to resolve on the delayed
scan, likely transient peristalsis. Colonic diverticulosis without
evidence of acute diverticulitis. Moderate amount of retained fecal
material throughout the colon. Appendix is normal.

Vascular/Lymphatic: Aortic atherosclerosis. No enlarged abdominal or
pelvic lymph nodes.

Reproductive: Prostate gland upper normal size.

Other: No ascites.

Musculoskeletal: No acute or significant osseous findings.
IMPRESSION: 1. No acute process identified.
2. Inhomogeneous liver suggesting hepatic steatosis.
3. Colonic diverticulosis.
